# Patient Record
Sex: Male | Born: 1960 | Hispanic: Yes | Marital: Married | Smoking: Former smoker
Health system: Southern US, Community
[De-identification: ages and names within clinical notes are randomized; demographics above are authoritative.]

## PROBLEM LIST (undated history)

## (undated) DIAGNOSIS — M48061 Spinal stenosis, lumbar region without neurogenic claudication: Secondary | ICD-10-CM

## (undated) DIAGNOSIS — T4145XA Adverse effect of unspecified anesthetic, initial encounter: Secondary | ICD-10-CM

## (undated) DIAGNOSIS — M545 Low back pain, unspecified: Secondary | ICD-10-CM

## (undated) DIAGNOSIS — M5416 Radiculopathy, lumbar region: Secondary | ICD-10-CM

## (undated) DIAGNOSIS — M4317 Spondylolisthesis, lumbosacral region: Secondary | ICD-10-CM

## (undated) DIAGNOSIS — M1990 Unspecified osteoarthritis, unspecified site: Secondary | ICD-10-CM

## (undated) HISTORY — DX: Spinal stenosis, lumbar region without neurogenic claudication: M48.061

## (undated) HISTORY — PX: REPLACEMENT TOTAL KNEE: SUR1224

## (undated) HISTORY — DX: Spondylolisthesis, lumbosacral region: M43.17

## (undated) HISTORY — DX: Radiculopathy, lumbar region: M54.16

## (undated) HISTORY — DX: Low back pain, unspecified: M54.50

---
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
## 2015-01-13 DEATH — deceased

## 2019-05-26 ENCOUNTER — Other Ambulatory Visit: Payer: Self-pay | Admitting: Orthopedic Surgery

## 2019-05-26 ENCOUNTER — Other Ambulatory Visit: Payer: Self-pay | Admitting: *Deleted

## 2019-05-27 ENCOUNTER — Telehealth: Payer: Self-pay | Admitting: *Deleted

## 2019-05-27 NOTE — Telephone Encounter (Signed)
Spoke with Lady Saucier case manager at [phone]). She will notify patient of appointment time and that he must have latest MRI disc with him at this appointment. She will arrange interpreter to be at this appointment with patient.

## 2019-06-14 ENCOUNTER — Telehealth: Payer: Self-pay | Admitting: *Deleted

## 2019-06-14 NOTE — Telephone Encounter (Signed)
Confirmed with Lady Saucier at [facility] patient is set up for appt on 06/15/2019 with Dr. Donnetta Hutching. Office/staff have arranged transportation, interpreter, MRI disc and will have patient at this office at 3:05 pm.

## 2019-06-15 ENCOUNTER — Ambulatory Visit (INDEPENDENT_AMBULATORY_CARE_PROVIDER_SITE_OTHER): Payer: Self-pay | Admitting: Vascular Surgery

## 2019-06-15 ENCOUNTER — Encounter: Payer: Self-pay | Admitting: Vascular Surgery

## 2019-06-15 ENCOUNTER — Other Ambulatory Visit: Payer: Self-pay

## 2019-06-15 VITALS — BP 124/80 | HR 79 | Temp 97.9°F | Resp 20 | Ht 66.0 in | Wt 216.8 lb

## 2019-06-15 DIAGNOSIS — M5137 Other intervertebral disc degeneration, lumbosacral region: Secondary | ICD-10-CM

## 2019-06-15 NOTE — Progress Notes (Signed)
Vascular and Vein Specialist of [facility]  Patient name: Calvin Marsh MRN: [id number] DOB: 02/16/1961 Sex: male  REASON FOR CONSULT: Discuss anterior exposure for L5-S1 disc fusion with Dr. Yevette Edwards  HPI: Calvin Marsh is a 58 y.o. male, who is here for discussion of planned lumbar fusion from anterior approach on 07/01/2019.  He is here with his workman Counsellor.  He is Spanish-speaking but understands some [place].  The workman comp representative is also helping with interpretation.  He reports progressive pain and is failed conservative treatment.  He has seen Dr. Yevette Edwards was recommended fusion at the L5-S1 level from the anterior approach.  He has never had any intra-abdominal surgery.  Does report severe pain in his back and legs.  He has no history of cardiac disease.  Past Medical History:  Diagnosis Date  . Bilateral lumbar radiculopathy   . Low back pain    Left greater than right leg pain  . 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 Spondylolisthesis at L5-S1 level     History reviewed. No pertinent family history.  SOCIAL HISTORY: Social History   Socioeconomic History  . Marital status: Married    Spouse name: Not on file  . Number of children: Not on file  . Years of education: Not on file  . Highest education level: Not on file  Occupational History  . Occupation: Programmer, systems  . Financial resource strain: Not on file  . Food insecurity    Worry: Not on file    Inability: Not on file  . Transportation needs    Medical: Not on file    Non-medical: Not on file  Tobacco Use  . Smoking status: Never Smoker  . Smokeless tobacco: Never Used  Substance and Sexual Activity  . Alcohol use: Yes    Comment: occasional  . Drug use: Not on file  . Sexual activity: Not on file  Lifestyle  . Physical activity    Days per week: Not on file    Minutes  per session: Not on file  . Stress: Not on file  Relationships  . Social Musician on phone: Not on file    Gets together: Not on file    Attends religious service: Not on file    Active member of club or organization: Not on file    Attends meetings of clubs or organizations: Not on file    Relationship status: Not on file  . Intimate partner violence    Fear of current or ex partner: Not on file    Emotionally abused: Not on file    Physically abused: Not on file    Forced sexual activity: Not on file  Other Topics Concern  . Not on file  Social History Narrative  . Not on file    No Known Allergies  Current Outpatient Medications  Medication Sig Dispense Refill  . cyclobenzaprine (FLEXERIL) 10 MG tablet TAKE 1 TABLET BY MOUTH AT BEDTIME    . sildenafil (VIAGRA) 50 MG tablet SMARTSIG:1-2 Tablet(s) By Mouth As Needed    . traMADol (ULTRAM) 50 MG tablet Take by mouth.     No current facility-administered medications for this visit.     REVIEW OF SYSTEMS:  [X]  denotes positive finding, [ ]  denotes negative finding Cardiac  Comments:  Chest pain or chest pressure:    Shortness of breath upon exertion:    Short of breath  when lying flat:    Irregular heart rhythm:        Vascular    Pain in calf, thigh, or hip brought on by ambulation:    Pain in feet at night that wakes you up from your sleep:     Blood clot in your veins:    Leg swelling:         Pulmonary    Oxygen at home:    Productive cough:     Wheezing:         Neurologic    Sudden weakness in arms or legs:     Sudden numbness in arms or legs:     Sudden onset of difficulty speaking or slurred speech:    Temporary loss of vision in one eye:     Problems with dizziness:         Gastrointestinal    Blood in stool:     Vomited blood:         Genitourinary    Burning when urinating:     Blood in urine:        Psychiatric    Major depression:         Hematologic    Bleeding problems:     Problems with blood clotting too easily:        Skin    Rashes or ulcers:        Constitutional    Fever or chills:      PHYSICAL EXAM: Vitals:   06/15/19 1508  BP: 124/80  Pulse: 79  Resp: 20  Temp: 97.9 F (36.6 C)  SpO2: 94%  Weight: 216 lb 12.8 oz (98.3 kg)  Height: 5\' 6"  (1.676 m)    GENERAL: The patient is a well-nourished male, in no acute distress. The vital signs are documented above. CARDIOVASCULAR: 2+ radial and 2+ dorsalis pedis pulses bilaterally PULMONARY: There is good air exchange  ABDOMEN: Soft and non-tender  MUSCULOSKELETAL: There are no major deformities or cyanosis. NEUROLOGIC: No focal weakness or paresthesias are detected. SKIN: There are no ulcers or rashes noted. PSYCHIATRIC: The patient has a normal affect.    MEDICAL ISSUES: I have discussed my role for exposure.  Discussed the technical aspects of mobilization of the rectus muscle, retroperitoneal exposure to the L5-S1 disc.  Explained mobilization of arterial venous structures overlying the spine and potential injury for all of these.  The patient does have multiple questions regarding the technical aspects of fusion and I have asked him to clarify this further with Dr. Lynann Bologna.  The understands my role for exposure.  I do not see any contraindication to planned for anterior exposure as scheduled on 07/01/2019   Rosetta Posner, MD [facility] Vascular and Vein Specialists of [facility] Tel [phone] Pager [phone]

## 2019-06-25 NOTE — Pre-Procedure Instructions (Signed)
Calvin Marsh  06/25/2019      [address], [place] [address] [place] [address] Phone: [phone] Fax: [phone]    Your procedure is scheduled on Dec. 17  Report to [facility] Entrance A at 5:30 A.M.  Call this number if you have problems the morning of surgery:  [phone]   Remember:  Do not eat  after midnight.  You may drink clear liquids until 4:30 A.M. .  Clear liquids allowed are:                    Water, Juice (non-citric and without pulp), Carbonated beverages, Clear Tea, Black Coffee only, Plain Jell-O only, Gatorade and Plain Popsicles only             Please complete your PRE-SURGERY ENSURE that was provided to you by 4:30 the morning of surgery.  Please, if able, drink it in one setting. DO NOT SIP.    Take these medicines the morning of surgery with A SIP OF WATER :             Tramadol if needed             7 days prior to surgery STOP taking any Aspirin (unless otherwise instructed by your surgeon), Aleve, Naproxen, Ibuprofen, Motrin, Advil, Goody's, BC's, all herbal medications, fish oil, and all vitamins.    Do not wear jewelry.  Do not wear lotions, powders, or perfumes, or deodorant.  Do not shave 48 hours prior to surgery.  Men may shave face and neck.  Do not bring valuables to the hospital.  [facility] is not responsible for any belongings or valuables.  Contacts, dentures or bridgework may not be worn into surgery.  Leave your suitcase in the car.  After surgery it may be brought to your room.  For patients admitted to the hospital, discharge time will be determined by your treatment team.  Patients discharged the day of surgery will not be allowed to drive home.    Special instructions:  [facility]- Preparing For Surgery  Before surgery, you can play an important role. Because skin is not sterile, your skin needs to be as free of germs as possible. You can reduce the number  of germs on your skin by washing with CHG (chlorahexidine gluconate) Soap before surgery.  CHG is an antiseptic cleaner which kills germs and bonds with the skin to continue killing germs even after washing.    Oral Hygiene is also important to reduce your risk of infection.  Remember - BRUSH YOUR TEETH THE MORNING OF SURGERY WITH YOUR REGULAR TOOTHPASTE  Please do not use if you have an allergy to CHG or antibacterial soaps. If your skin becomes reddened/irritated stop using the CHG.  Do not shave (including legs and underarms) for at least 48 hours prior to first CHG shower. It is OK to shave your face.  Please follow these instructions carefully.   1. Shower the NIGHT BEFORE SURGERY and the MORNING OF SURGERY with CHG.   2. If you chose to wash your hair, wash your hair first as usual with your normal shampoo.  3. After you shampoo, rinse your hair and body thoroughly to remove the shampoo.  4. Use CHG as you would any other liquid soap. You can apply CHG directly to the skin and wash gently with a scrungie or a clean washcloth.   5. Apply the  CHG Soap to your body ONLY FROM THE NECK DOWN.  Do not use on open wounds or open sores. Avoid contact with your eyes, ears, mouth and genitals (private parts). Wash Face and genitals (private parts)  with your normal soap.  6. Wash thoroughly, paying special attention to the area where your surgery will be performed.  7. Thoroughly rinse your body with warm water from the neck down.  8. DO NOT shower/wash with your normal soap after using and rinsing off the CHG Soap.  9. Pat yourself dry with a CLEAN TOWEL.  10. Wear CLEAN PAJAMAS to bed the night before surgery, wear comfortable clothes the morning of surgery  11. Place CLEAN SHEETS on your bed the night of your first shower and DO NOT SLEEP WITH PETS.    Day of Surgery:  Do not apply any deodorants/lotions.  Please wear clean clothes to the hospital/surgery center.   Remember to  brush your teeth WITH YOUR REGULAR TOOTHPASTE.    Please read over the following fact sheets that you were given. Coughing and Deep Breathing and Surgical Site Infection Prevention

## 2019-06-28 ENCOUNTER — Inpatient Hospital Stay (HOSPITAL_COMMUNITY): Admission: RE | Admit: 2019-06-28 | Payer: Self-pay | Source: Ambulatory Visit

## 2019-06-28 ENCOUNTER — Encounter (HOSPITAL_COMMUNITY)
Admission: RE | Admit: 2019-06-28 | Discharge: 2019-06-28 | Disposition: A | Discharge status: Home / Self Care | Source: Ambulatory Visit | Attending: Orthopedic Surgery | Admitting: Orthopedic Surgery

## 2019-06-28 ENCOUNTER — Other Ambulatory Visit: Payer: Self-pay

## 2019-06-28 ENCOUNTER — Encounter (HOSPITAL_COMMUNITY): Payer: Self-pay

## 2019-06-28 DIAGNOSIS — M5416 Radiculopathy, lumbar region: Secondary | ICD-10-CM | POA: Insufficient documentation

## 2019-06-28 DIAGNOSIS — M4317 Spondylolisthesis, lumbosacral region: Secondary | ICD-10-CM | POA: Insufficient documentation

## 2019-06-28 DIAGNOSIS — Z01812 Encounter for preprocedural laboratory examination: Secondary | ICD-10-CM | POA: Insufficient documentation

## 2019-06-28 DIAGNOSIS — M4807 Spinal stenosis, lumbosacral region: Secondary | ICD-10-CM | POA: Insufficient documentation

## 2019-06-28 LAB — CBC WITH DIFFERENTIAL/PLATELET
Abs Immature Granulocytes: 0.02 10*3/uL (ref 0.00–0.07)
Basophils Absolute: 0 10*3/uL (ref 0.0–0.1)
Basophils Relative: 1 %
Eosinophils Absolute: 0.1 10*3/uL (ref 0.0–0.5)
Eosinophils Relative: 1 %
HCT: 44.3 % (ref 39.0–52.0)
Hemoglobin: 15.1 g/dL (ref 13.0–17.0)
Immature Granulocytes: 0 %
Lymphocytes Relative: 44 %
Lymphs Abs: 3.3 10*3/uL (ref 0.7–4.0)
MCH: 34.5 pg — ABNORMAL HIGH (ref 26.0–34.0)
MCHC: 34.1 g/dL (ref 30.0–36.0)
MCV: 101.1 fL — ABNORMAL HIGH (ref 80.0–100.0)
Monocytes Absolute: 0.4 10*3/uL (ref 0.1–1.0)
Monocytes Relative: 5 %
Neutro Abs: 3.7 10*3/uL (ref 1.7–7.7)
Neutrophils Relative %: 49 %
Platelets: 268 10*3/uL (ref 150–400)
RBC: 4.38 MIL/uL (ref 4.22–5.81)
RDW: 13.3 % (ref 11.5–15.5)
WBC: 7.5 10*3/uL (ref 4.0–10.5)
nRBC: 0 % (ref 0.0–0.2)

## 2019-06-28 LAB — COMPREHENSIVE METABOLIC PANEL
ALT: 24 U/L (ref 0–44)
AST: 25 U/L (ref 15–41)
Albumin: 4.3 g/dL (ref 3.5–5.0)
Alkaline Phosphatase: 61 U/L (ref 38–126)
Anion gap: 9 (ref 5–15)
BUN: 14 mg/dL (ref 6–20)
CO2: 27 mmol/L (ref 22–32)
Calcium: 9.2 mg/dL (ref 8.9–10.3)
Chloride: 104 mmol/L (ref 98–111)
Creatinine, Ser: 0.89 mg/dL (ref 0.61–1.24)
GFR calc Af Amer: >60 mL/min (ref >60)
GFR calc non Af Amer: >60 mL/min (ref >60)
Glucose, Bld: 120 mg/dL — ABNORMAL HIGH (ref 70–99)
Potassium: 3.8 mmol/L (ref 3.5–5.1)
Sodium: 140 mmol/L (ref 135–145)
Total Bilirubin: 0.7 mg/dL (ref 0.3–1.2)
Total Protein: 6.8 g/dL (ref 6.5–8.1)

## 2019-06-28 LAB — TYPE AND SCREEN
ABO/RH(D): O POS
Antibody Screen: NEGATIVE

## 2019-06-28 LAB — URINALYSIS, ROUTINE W REFLEX MICROSCOPIC
Bilirubin Urine: NEGATIVE
Glucose, UA: NEGATIVE mg/dL
Hgb urine dipstick: NEGATIVE
Ketones, ur: NEGATIVE mg/dL
Leukocytes,Ua: NEGATIVE
Nitrite: NEGATIVE
Protein, ur: NEGATIVE mg/dL
Specific Gravity, Urine: 1.025 (ref 1.005–1.030)
pH: 5 (ref 5.0–8.0)

## 2019-06-28 LAB — SURGICAL PCR SCREEN
MRSA, PCR: NEGATIVE
Staphylococcus aureus: NEGATIVE

## 2019-06-28 LAB — ABO/RH: ABO/RH(D): O POS

## 2019-06-28 LAB — PROTIME-INR
INR: 1 (ref 0.8–1.2)
Prothrombin Time: 13 seconds (ref 11.4–15.2)

## 2019-06-28 LAB — APTT: aPTT: 33 seconds (ref 24–36)

## 2019-06-28 NOTE — Progress Notes (Signed)
PCP:  Uses Urgent Care if needed Cardiologist:  Denies  EKG: N/A CXR:  N/A ECHO:  denies Stress Test:  denies Cardiac Cath:  denies  Covid test 06/28/19  Patient denies shortness of breath, fever, cough, and chest pain at PAT appointment.  Patient verbalized understanding of instructions provided today at the PAT appointment.  Patient asked to review instructions at home and day of surgery.

## 2019-06-30 NOTE — Anesthesia Preprocedure Evaluation (Addendum)
Anesthesia Evaluation  Patient identified by MRN, date of birth, ID band Patient awake    Reviewed: Allergy & Precautions, H&P , NPO status , Patient's Chart, lab work & pertinent test results  Airway Mallampati: I  TM Distance: >3 FB Neck ROM: Full    Dental no notable dental hx. (+) Edentulous Upper, Dental Advisory Given   Pulmonary neg pulmonary ROS, former smoker,    Pulmonary exam normal breath sounds clear to auscultation       Cardiovascular Exercise Tolerance: Good negative cardio ROS   Rhythm:Regular Rate:Normal     Neuro/Psych negative neurological ROS  negative psych ROS   GI/Hepatic negative GI ROS, Neg liver ROS,   Endo/Other  negative endocrine ROS  Renal/GU negative Renal ROS  negative genitourinary   Musculoskeletal   Abdominal   Peds  Hematology negative hematology ROS (+)   Anesthesia Other Findings   Reproductive/Obstetrics negative OB ROS                            Anesthesia Physical Anesthesia Plan  ASA: II  Anesthesia Plan: General   Post-op Pain Management:    Induction: Intravenous  PONV Risk Score and Plan: 3 and Ondansetron, Dexamethasone and Midazolam  Airway Management Planned: Oral ETT  Additional Equipment: Arterial line  Intra-op Plan:   Post-operative Plan: Extubation in OR and Possible Post-op intubation/ventilation  Informed Consent: I have reviewed the patients History and Physical, chart, labs and discussed the procedure including the risks, benefits and alternatives for the proposed anesthesia with the patient or authorized representative who has indicated his/her understanding and acceptance.     Dental advisory given  Plan Discussed with: CRNA  Anesthesia Plan Comments:         Anesthesia Quick Evaluation

## 2019-07-01 ENCOUNTER — Inpatient Hospital Stay (HOSPITAL_COMMUNITY)
Admission: RE | Admit: 2019-07-01 | Discharge: 2019-07-02 | DRG: 455 | Disposition: A | Discharge status: Home / Self Care | Attending: Orthopedic Surgery | Admitting: Orthopedic Surgery

## 2019-07-01 ENCOUNTER — Inpatient Hospital Stay (HOSPITAL_COMMUNITY): Admitting: Certified Registered"

## 2019-07-01 ENCOUNTER — Encounter (HOSPITAL_COMMUNITY)
Admission: RE | Disposition: A | Discharge status: Home / Self Care | Payer: Self-pay | Source: Home / Self Care | Attending: Orthopedic Surgery

## 2019-07-01 ENCOUNTER — Inpatient Hospital Stay (HOSPITAL_COMMUNITY)

## 2019-07-01 ENCOUNTER — Encounter (HOSPITAL_COMMUNITY): Payer: Self-pay | Admitting: Orthopedic Surgery

## 2019-07-01 ENCOUNTER — Other Ambulatory Visit: Payer: Self-pay

## 2019-07-01 DIAGNOSIS — Z79899 Other long term (current) drug therapy: Secondary | ICD-10-CM | POA: Diagnosis not present

## 2019-07-01 DIAGNOSIS — M4317 Spondylolisthesis, lumbosacral region: Secondary | ICD-10-CM | POA: Diagnosis present

## 2019-07-01 DIAGNOSIS — Z96651 Presence of right artificial knee joint: Secondary | ICD-10-CM | POA: Diagnosis present

## 2019-07-01 DIAGNOSIS — M5410 Radiculopathy, site unspecified: Secondary | ICD-10-CM | POA: Diagnosis present

## 2019-07-01 DIAGNOSIS — M5117 Intervertebral disc disorders with radiculopathy, lumbosacral region: Secondary | ICD-10-CM | POA: Diagnosis present

## 2019-07-01 DIAGNOSIS — M4807 Spinal stenosis, lumbosacral region: Secondary | ICD-10-CM | POA: Diagnosis present

## 2019-07-01 DIAGNOSIS — Z20828 Contact with and (suspected) exposure to other viral communicable diseases: Secondary | ICD-10-CM | POA: Diagnosis present

## 2019-07-01 DIAGNOSIS — Z87891 Personal history of nicotine dependence: Secondary | ICD-10-CM

## 2019-07-01 DIAGNOSIS — Z419 Encounter for procedure for purposes other than remedying health state, unspecified: Secondary | ICD-10-CM

## 2019-07-01 DIAGNOSIS — M5416 Radiculopathy, lumbar region: Secondary | ICD-10-CM | POA: Diagnosis not present

## 2019-07-01 HISTORY — PX: ANTERIOR LUMBAR FUSION: SHX1170

## 2019-07-01 LAB — RESPIRATORY PANEL BY RT PCR (FLU A&B, COVID)
Influenza A by PCR: NEGATIVE
Influenza B by PCR: NEGATIVE
SARS Coronavirus 2 by RT PCR: NEGATIVE

## 2019-07-01 SURGERY — ANTERIOR LUMBAR FUSION 2 LEVELS
Anesthesia: General | Laterality: Bilateral

## 2019-07-01 MED ORDER — BISACODYL 5 MG PO TBEC
5.0000 mg | DELAYED_RELEASE_TABLET | Freq: Every day | ORAL | Status: DC | PRN
  Administered 2019-07-01: 21:00:00 5 mg via ORAL
  Filled 2019-07-01: qty 1

## 2019-07-01 MED ORDER — PROPOFOL 10 MG/ML IV BOLUS
INTRAVENOUS | Status: DC | PRN
  Administered 2019-07-01: 120 mg via INTRAVENOUS
  Administered 2019-07-01: 30 mg via INTRAVENOUS
  Administered 2019-07-01: 50 mg via INTRAVENOUS

## 2019-07-01 MED ORDER — PHENYLEPHRINE 40 MCG/ML (10ML) SYRINGE FOR IV PUSH (FOR BLOOD PRESSURE SUPPORT)
PREFILLED_SYRINGE | INTRAVENOUS | Status: DC | PRN
  Administered 2019-07-01: 80 ug via INTRAVENOUS
  Administered 2019-07-01: 120 ug via INTRAVENOUS
  Administered 2019-07-01 (×5): 80 ug via INTRAVENOUS

## 2019-07-01 MED ORDER — PHENYLEPHRINE 40 MCG/ML (10ML) SYRINGE FOR IV PUSH (FOR BLOOD PRESSURE SUPPORT)
PREFILLED_SYRINGE | INTRAVENOUS | Status: AC
  Filled 2019-07-01: qty 10

## 2019-07-01 MED ORDER — PROPOFOL 500 MG/50ML IV EMUL
INTRAVENOUS | Status: DC | PRN
  Administered 2019-07-01: 50 ug/kg/min via INTRAVENOUS

## 2019-07-01 MED ORDER — LIDOCAINE 2% (20 MG/ML) 5 ML SYRINGE
INTRAMUSCULAR | Status: AC
  Filled 2019-07-01: qty 5

## 2019-07-01 MED ORDER — BUPIVACAINE LIPOSOME 1.3 % IJ SUSP
20.0000 mL | INTRAMUSCULAR | Status: AC
  Administered 2019-07-01: 20 mL
  Filled 2019-07-01: qty 20

## 2019-07-01 MED ORDER — CHLORHEXIDINE GLUCONATE 4 % EX LIQD: 60.0000 mL | Freq: Once | CUTANEOUS | Status: DC

## 2019-07-01 MED ORDER — LIDOCAINE 2% (20 MG/ML) 5 ML SYRINGE
INTRAMUSCULAR | Status: DC | PRN
  Administered 2019-07-01: 60 mg via INTRAVENOUS

## 2019-07-01 MED ORDER — ACETAMINOPHEN 500 MG PO TABS: 1000.0000 mg | ORAL_TABLET | Freq: Once | ORAL | Status: AC

## 2019-07-01 MED ORDER — CEFAZOLIN SODIUM-DEXTROSE 2-4 GM/100ML-% IV SOLN
2.0000 g | Freq: Three times a day (TID) | INTRAVENOUS | Status: AC
  Administered 2019-07-01 (×2): 2 g via INTRAVENOUS
  Filled 2019-07-01 (×2): qty 100

## 2019-07-01 MED ORDER — SODIUM CHLORIDE 0.9 % IV SOLN: 250.0000 mL | INTRAVENOUS | Status: DC

## 2019-07-01 MED ORDER — OXYCODONE-ACETAMINOPHEN 5-325 MG PO TABS
1.0000 | ORAL_TABLET | ORAL | Status: DC | PRN
  Administered 2019-07-01 – 2019-07-02 (×5): 2 via ORAL
  Filled 2019-07-01 (×5): qty 2

## 2019-07-01 MED ORDER — POTASSIUM CHLORIDE IN NACL 20-0.9 MEQ/L-% IV SOLN: INTRAVENOUS | Status: DC

## 2019-07-01 MED ORDER — ACETAMINOPHEN 325 MG PO TABS: 650.0000 mg | ORAL_TABLET | ORAL | Status: DC | PRN

## 2019-07-01 MED ORDER — DEXAMETHASONE SODIUM PHOSPHATE 10 MG/ML IJ SOLN
INTRAMUSCULAR | Status: AC
  Filled 2019-07-01: qty 1

## 2019-07-01 MED ORDER — METHYLENE BLUE 0.5 % INJ SOLN
INTRAVENOUS | Status: AC
  Filled 2019-07-01: qty 10

## 2019-07-01 MED ORDER — EPINEPHRINE PF 1 MG/ML IJ SOLN
INTRAMUSCULAR | Status: DC | PRN
  Administered 2019-07-01: 1 mg

## 2019-07-01 MED ORDER — HYDROMORPHONE HCL 1 MG/ML IJ SOLN
INTRAMUSCULAR | Status: AC
  Filled 2019-07-01: qty 1

## 2019-07-01 MED ORDER — ONDANSETRON HCL 4 MG PO TABS: 4.0000 mg | ORAL_TABLET | Freq: Four times a day (QID) | ORAL | Status: DC | PRN

## 2019-07-01 MED ORDER — ZOLPIDEM TARTRATE 5 MG PO TABS: 5.0000 mg | ORAL_TABLET | Freq: Every evening | ORAL | Status: DC | PRN

## 2019-07-01 MED ORDER — ONDANSETRON HCL 4 MG/2ML IJ SOLN
INTRAMUSCULAR | Status: AC
  Filled 2019-07-01: qty 2

## 2019-07-01 MED ORDER — MENTHOL 3 MG MT LOZG: 1.0000 | LOZENGE | OROMUCOSAL | Status: DC | PRN

## 2019-07-01 MED ORDER — MIDAZOLAM HCL 2 MG/2ML IJ SOLN
INTRAMUSCULAR | Status: DC | PRN
  Administered 2019-07-01: 2 mg via INTRAVENOUS

## 2019-07-01 MED ORDER — PHENYLEPHRINE HCL-NACL 10-0.9 MG/250ML-% IV SOLN
INTRAVENOUS | Status: DC | PRN
  Administered 2019-07-01: 15 ug/min via INTRAVENOUS

## 2019-07-01 MED ORDER — FENTANYL CITRATE (PF) 250 MCG/5ML IJ SOLN
INTRAMUSCULAR | Status: AC
  Filled 2019-07-01: qty 5

## 2019-07-01 MED ORDER — POVIDONE-IODINE 7.5 % EX SOLN
Freq: Once | CUTANEOUS | Status: DC
  Filled 2019-07-01: qty 118

## 2019-07-01 MED ORDER — LACTATED RINGERS IV SOLN: INTRAVENOUS | Status: DC | PRN

## 2019-07-01 MED ORDER — PHENOL 1.4 % MT LIQD: 1.0000 | OROMUCOSAL | Status: DC | PRN

## 2019-07-01 MED ORDER — HYDROMORPHONE HCL 1 MG/ML IJ SOLN
0.2500 mg | INTRAMUSCULAR | Status: DC | PRN
  Administered 2019-07-01 (×2): 0.5 mg via INTRAVENOUS

## 2019-07-01 MED ORDER — DOCUSATE SODIUM 100 MG PO CAPS
100.0000 mg | ORAL_CAPSULE | Freq: Two times a day (BID) | ORAL | Status: DC
  Administered 2019-07-01 – 2019-07-02 (×3): 100 mg via ORAL
  Filled 2019-07-01 (×3): qty 1

## 2019-07-01 MED ORDER — ALUM & MAG HYDROXIDE-SIMETH 200-200-20 MG/5ML PO SUSP: 30.0000 mL | Freq: Four times a day (QID) | ORAL | Status: DC | PRN

## 2019-07-01 MED ORDER — METHOCARBAMOL 500 MG PO TABS
500.0000 mg | ORAL_TABLET | Freq: Four times a day (QID) | ORAL | Status: DC | PRN
  Administered 2019-07-01 – 2019-07-02 (×3): 500 mg via ORAL
  Filled 2019-07-01 (×3): qty 1

## 2019-07-01 MED ORDER — 0.9 % SODIUM CHLORIDE (POUR BTL) OPTIME
TOPICAL | Status: DC | PRN
  Administered 2019-07-01: 09:00:00 1000 mL

## 2019-07-01 MED ORDER — SUGAMMADEX SODIUM 200 MG/2ML IV SOLN
INTRAVENOUS | Status: DC | PRN
  Administered 2019-07-01: 200 mg via INTRAVENOUS

## 2019-07-01 MED ORDER — EPINEPHRINE PF 1 MG/ML IJ SOLN
INTRAMUSCULAR | Status: AC
  Filled 2019-07-01: qty 1

## 2019-07-01 MED ORDER — ALBUMIN HUMAN 5 % IV SOLN: INTRAVENOUS | Status: DC | PRN

## 2019-07-01 MED ORDER — CEFAZOLIN SODIUM-DEXTROSE 2-4 GM/100ML-% IV SOLN
2.0000 g | INTRAVENOUS | Status: AC
  Administered 2019-07-01: 2 g via INTRAVENOUS

## 2019-07-01 MED ORDER — ONDANSETRON HCL 4 MG/2ML IJ SOLN
INTRAMUSCULAR | Status: DC | PRN
  Administered 2019-07-01: 4 mg via INTRAVENOUS

## 2019-07-01 MED ORDER — MIDAZOLAM HCL 2 MG/2ML IJ SOLN
INTRAMUSCULAR | Status: AC
  Filled 2019-07-01: qty 2

## 2019-07-01 MED ORDER — METHOCARBAMOL 1000 MG/10ML IJ SOLN: 500.0000 mg | Freq: Four times a day (QID) | INTRAVENOUS | Status: DC | PRN

## 2019-07-01 MED ORDER — ACETAMINOPHEN 500 MG PO TABS
ORAL_TABLET | ORAL | Status: AC
  Administered 2019-07-01: 07:00:00 1000 mg via ORAL
  Filled 2019-07-01: qty 2

## 2019-07-01 MED ORDER — MORPHINE SULFATE (PF) 2 MG/ML IV SOLN
1.0000 mg | INTRAVENOUS | Status: DC | PRN
  Administered 2019-07-01: 2 mg via INTRAVENOUS
  Filled 2019-07-01: qty 1

## 2019-07-01 MED ORDER — BUPIVACAINE HCL (PF) 0.25 % IJ SOLN
INTRAMUSCULAR | Status: DC | PRN
  Administered 2019-07-01: 30 mL

## 2019-07-01 MED ORDER — ROCURONIUM BROMIDE 10 MG/ML (PF) SYRINGE
PREFILLED_SYRINGE | INTRAVENOUS | Status: DC | PRN
  Administered 2019-07-01: 10 mg via INTRAVENOUS
  Administered 2019-07-01: 50 mg via INTRAVENOUS
  Administered 2019-07-01 (×2): 20 mg via INTRAVENOUS

## 2019-07-01 MED ORDER — THROMBIN 20000 UNITS EX KIT
PACK | CUTANEOUS | Status: AC
  Filled 2019-07-01: qty 1

## 2019-07-01 MED ORDER — PROPOFOL 10 MG/ML IV BOLUS
INTRAVENOUS | Status: AC
  Filled 2019-07-01: qty 20

## 2019-07-01 MED ORDER — FENTANYL CITRATE (PF) 100 MCG/2ML IJ SOLN
INTRAMUSCULAR | Status: DC | PRN
  Administered 2019-07-01 (×2): 50 ug via INTRAVENOUS

## 2019-07-01 MED ORDER — SODIUM CHLORIDE 0.9% FLUSH: 3.0000 mL | INTRAVENOUS | Status: DC | PRN

## 2019-07-01 MED ORDER — CEFAZOLIN SODIUM-DEXTROSE 2-4 GM/100ML-% IV SOLN
INTRAVENOUS | Status: AC
  Filled 2019-07-01: qty 100

## 2019-07-01 MED ORDER — SENNOSIDES-DOCUSATE SODIUM 8.6-50 MG PO TABS: 1.0000 | ORAL_TABLET | Freq: Every evening | ORAL | Status: DC | PRN

## 2019-07-01 MED ORDER — ONDANSETRON HCL 4 MG/2ML IJ SOLN: 4.0000 mg | Freq: Four times a day (QID) | INTRAMUSCULAR | Status: DC | PRN

## 2019-07-01 MED ORDER — SODIUM CHLORIDE 0.9% FLUSH
3.0000 mL | Freq: Two times a day (BID) | INTRAVENOUS | Status: DC
  Administered 2019-07-01: 21:00:00 3 mL via INTRAVENOUS

## 2019-07-01 MED ORDER — HEMOSTATIC AGENTS (NO CHARGE) OPTIME
TOPICAL | Status: DC | PRN
  Administered 2019-07-01: 1 via TOPICAL

## 2019-07-01 MED ORDER — FLEET ENEMA 7-19 GM/118ML RE ENEM: 1.0000 | ENEMA | Freq: Once | RECTAL | Status: DC | PRN

## 2019-07-01 MED ORDER — BUPIVACAINE HCL (PF) 0.25 % IJ SOLN
INTRAMUSCULAR | Status: AC
  Filled 2019-07-01: qty 30

## 2019-07-01 MED ORDER — DEXAMETHASONE SODIUM PHOSPHATE 10 MG/ML IJ SOLN
INTRAMUSCULAR | Status: DC | PRN
  Administered 2019-07-01: 10 mg via INTRAVENOUS

## 2019-07-01 MED ORDER — ROCURONIUM BROMIDE 10 MG/ML (PF) SYRINGE
PREFILLED_SYRINGE | INTRAVENOUS | Status: AC
  Filled 2019-07-01: qty 10

## 2019-07-01 MED ORDER — ACETAMINOPHEN 650 MG RE SUPP: 650.0000 mg | RECTAL | Status: DC | PRN

## 2019-07-01 SURGICAL SUPPLY — 111 items
APPLIER CLIP 11 MED OPEN (CLIP) ×3
BENZOIN TINCTURE PRP APPL 2/3 (GAUZE/BANDAGES/DRESSINGS) ×4 IMPLANT
BLADE CLIPPER SURG (BLADE) ×2 IMPLANT
BLADE SURG 10 STRL SS (BLADE) ×5 IMPLANT
BONE VIVIGEN FORMABLE 10CC (Bone Implant) ×3 IMPLANT
BUR PRECISION FLUTE 5.0 (BURR) ×2 IMPLANT
BUR PRESCISION 1.7 ELITE (BURR) IMPLANT
BUR ROUND PRECISION 4.0 (BURR) IMPLANT
BUR ROUND PRECISION 4.0MM (BURR)
BUR SABER RD CUTTING 3.0 (BURR) IMPLANT
BUR SABER RD CUTTING 3.0MM (BURR)
CARTRIDGE OIL MAESTRO DRILL (MISCELLANEOUS) ×2 IMPLANT
CLIP APPLIE 11 MED OPEN (CLIP) ×1 IMPLANT
CLOSURE STERI-STRIP 1/2X4 (GAUZE/BANDAGES/DRESSINGS) ×2
CLOSURE WOUND 1/2 X4 (GAUZE/BANDAGES/DRESSINGS)
CLSR STERI-STRIP ANTIMIC 1/2X4 (GAUZE/BANDAGES/DRESSINGS) ×2 IMPLANT
CONT SPEC 4OZ CLIKSEAL STRL BL (MISCELLANEOUS) ×3 IMPLANT
CORD BIPOLAR FORCEPS 12FT (ELECTRODE) ×3 IMPLANT
COVER SURGICAL LIGHT HANDLE (MISCELLANEOUS) ×5 IMPLANT
COVER WAND RF STERILE (DRAPES) ×1 IMPLANT
DIFFUSER DRILL AIR PNEUMATIC (MISCELLANEOUS) ×6 IMPLANT
DRAIN CHANNEL 15F RND FF W/TCR (WOUND CARE) ×1 IMPLANT
DRAPE C-ARM 42X72 X-RAY (DRAPES) ×6 IMPLANT
DRAPE C-ARMOR (DRAPES) ×4 IMPLANT
DRAPE POUCH INSTRU U-SHP 10X18 (DRAPES) ×3 IMPLANT
DRAPE SURG 17X23 STRL (DRAPES) ×12 IMPLANT
DRSG MEPILEX BORDER 4X12 (GAUZE/BANDAGES/DRESSINGS) ×3 IMPLANT
DRSG MEPILEX BORDER 4X8 (GAUZE/BANDAGES/DRESSINGS) IMPLANT
DURAPREP 26ML APPLICATOR (WOUND CARE) ×3 IMPLANT
ELECT BLADE 4.0 EZ CLEAN MEGAD (MISCELLANEOUS) ×3
ELECT CAUTERY BLADE 6.4 (BLADE) ×6 IMPLANT
ELECT REM PT RETURN 9FT ADLT (ELECTROSURGICAL) ×3
ELECTRODE BLDE 4.0 EZ CLN MEGD (MISCELLANEOUS) ×1 IMPLANT
ELECTRODE REM PT RTRN 9FT ADLT (ELECTROSURGICAL) ×1 IMPLANT
EVACUATOR SILICONE 100CC (DRAIN) ×3 IMPLANT
FEE INTRAOP MONITOR IMPULS NCS (MISCELLANEOUS) IMPLANT
GAUZE 4X4 16PLY RFD (DISPOSABLE) ×8 IMPLANT
GAUZE SPONGE 4X4 12PLY STRL (GAUZE/BANDAGES/DRESSINGS) ×3 IMPLANT
GAUZE SPONGE 4X4 16PLY XRAY LF (GAUZE/BANDAGES/DRESSINGS) ×4 IMPLANT
GLOVE BIO SURGEON STRL SZ7 (GLOVE) ×5 IMPLANT
GLOVE BIO SURGEON STRL SZ8 (GLOVE) ×5 IMPLANT
GLOVE BIOGEL PI IND STRL 7.0 (GLOVE) ×1 IMPLANT
GLOVE BIOGEL PI IND STRL 8 (GLOVE) ×2 IMPLANT
GLOVE BIOGEL PI INDICATOR 7.0 (GLOVE) ×4
GLOVE BIOGEL PI INDICATOR 8 (GLOVE) ×4
GOWN STRL REUS W/ TWL LRG LVL3 (GOWN DISPOSABLE) ×2 IMPLANT
GOWN STRL REUS W/ TWL XL LVL3 (GOWN DISPOSABLE) ×1 IMPLANT
GOWN STRL REUS W/TWL LRG LVL3 (GOWN DISPOSABLE) ×4
GOWN STRL REUS W/TWL XL LVL3 (GOWN DISPOSABLE) ×8
GRAFT BNE MATRIX VG FRMBL L 10 (Bone Implant) IMPLANT
GUIDEWIRE BLUNT VIPER II 1.45 (WIRE) ×2 IMPLANT
GUIDEWIRE SHARP VIPER II (WIRE) ×8 IMPLANT
HEMOSTAT SURGICEL 2X14 (HEMOSTASIS) ×2 IMPLANT
INTRAOP MONITOR FEE IMPULS NCS (MISCELLANEOUS) ×1
INTRAOP MONITOR FEE IMPULSE (MISCELLANEOUS) ×2
IV CATH 14GX2 1/4 (CATHETERS) ×3 IMPLANT
KIT ALARA NEURO ACCESS (KITS) ×6 IMPLANT
KIT BASIN OR (CUSTOM PROCEDURE TRAY) ×3 IMPLANT
KIT POSITION SURG JACKSON T1 (MISCELLANEOUS) ×3 IMPLANT
KIT TURNOVER KIT B (KITS) ×3 IMPLANT
MARKER SKIN DUAL TIP RULER LAB (MISCELLANEOUS) ×3 IMPLANT
NDL HYPO 25GX1X1/2 BEV (NEEDLE) ×1 IMPLANT
NDL SPNL 18GX3.5 QUINCKE PK (NEEDLE) ×2 IMPLANT
NEEDLE HYPO 25GX1X1/2 BEV (NEEDLE) ×3 IMPLANT
NEEDLE SPNL 18GX3.5 QUINCKE PK (NEEDLE) ×6 IMPLANT
NS IRRIG 1000ML POUR BTL (IV SOLUTION) ×3 IMPLANT
OIL CARTRIDGE MAESTRO DRILL (MISCELLANEOUS) ×6
PACK LAMINECTOMY ORTHO (CUSTOM PROCEDURE TRAY) ×3 IMPLANT
PACK UNIVERSAL I (CUSTOM PROCEDURE TRAY) ×5 IMPLANT
PAD ARMBOARD 7.5X6 YLW CONV (MISCELLANEOUS) ×6 IMPLANT
PATTIES SURGICAL .5 X1 (DISPOSABLE) ×3 IMPLANT
PATTIES SURGICAL .5 X3 (DISPOSABLE) IMPLANT
PATTIES SURGICAL .5X1.5 (GAUZE/BANDAGES/DRESSINGS) ×3 IMPLANT
PATTIES SURGICAL .75X.75 (GAUZE/BANDAGES/DRESSINGS) ×3 IMPLANT
PROBE PEDICLE 2.3 SCREW BALL TIP ×2 IMPLANT
ROD VIPER II LORDOSED 5.5X40 (Rod) ×4 IMPLANT
SCREW SET SINGLE INNER MIS (Screw) ×8 IMPLANT
SCREW XTAB POLY VIPER  7X45 (Screw) ×8 IMPLANT
SCREW XTAB POLY VIPER 7X45 (Screw) IMPLANT
SPONGE INTESTINAL PEANUT (DISPOSABLE) ×6 IMPLANT
SPONGE SURGIFOAM ABS GEL 100 (HEMOSTASIS) ×6 IMPLANT
STRIP CLOSURE SKIN 1/2X4 (GAUZE/BANDAGES/DRESSINGS) IMPLANT
SURGIFLO W/THROMBIN 8M KIT (HEMOSTASIS) ×2 IMPLANT
SUT MNCRL AB 4-0 PS2 18 (SUTURE) ×3 IMPLANT
SUT PDS AB 1 CTX 36 (SUTURE) ×6 IMPLANT
SUT PROLENE 5 0 C 1 24 (SUTURE) IMPLANT
SUT SILK 2 0 TIES 10X30 (SUTURE) ×3 IMPLANT
SUT SILK 3 0 TIES 10X30 (SUTURE) ×3 IMPLANT
SUT VIC AB 0 CT1 18XCR BRD 8 (SUTURE) ×2 IMPLANT
SUT VIC AB 0 CT1 8-18 (SUTURE) ×4
SUT VIC AB 1 CT1 18XCR BRD 8 (SUTURE) ×2 IMPLANT
SUT VIC AB 1 CT1 27 (SUTURE) ×4
SUT VIC AB 1 CT1 27XBRD ANBCTR (SUTURE) ×2 IMPLANT
SUT VIC AB 1 CT1 8-18 (SUTURE) ×4
SUT VIC AB 1 CTX 36 (SUTURE) ×4
SUT VIC AB 1 CTX36XBRD ANBCTR (SUTURE) ×2 IMPLANT
SUT VIC AB 2-0 CT2 18 VCP726D (SUTURE) ×6 IMPLANT
SYNCAGE EVOL MD 17X11.4 14D (Spacer) ×2 IMPLANT
SYR 20ML LL LF (SYRINGE) ×3 IMPLANT
SYR BULB IRRIGATION 50ML (SYRINGE) ×3 IMPLANT
SYR CONTROL 10ML LL (SYRINGE) ×6 IMPLANT
SYR TB 1ML LUER SLIP (SYRINGE) ×3 IMPLANT
TAP CANN VIPER2 DL 6.0 (TAP) ×4 IMPLANT
TAPE CLOTH SURG 4X10 WHT LF (GAUZE/BANDAGES/DRESSINGS) ×4 IMPLANT
TOWEL GREEN STERILE (TOWEL DISPOSABLE) ×3 IMPLANT
TOWEL GREEN STERILE FF (TOWEL DISPOSABLE) ×3 IMPLANT
TRAY FOLEY MTR SLVR 16FR STAT (SET/KITS/TRAYS/PACK) ×3 IMPLANT
TRAY FOLEY W/BAG SLVR 16FR (SET/KITS/TRAYS/PACK) ×2
TRAY FOLEY W/BAG SLVR 16FR ST (SET/KITS/TRAYS/PACK) ×1 IMPLANT
WATER STERILE IRR 1000ML POUR (IV SOLUTION) ×1 IMPLANT
YANKAUER SUCT BULB TIP NO VENT (SUCTIONS) ×3 IMPLANT

## 2019-07-01 NOTE — Op Note (Signed)
PATIENT NAME: Calvin Marsh   MEDICAL RECORD NO.:   [id number]    DATE OF BIRTH: 03/09/1961   DATE OF PROCEDURE: 07/01/2019                              OPERATIVE REPORT   PREOPERATIVE DIAGNOSES: 1. Bilateral L5 radiculopathy. 2. Severe bilateral neuroforaminal stenosis, L5-S1  3. L5-S1 degenerative disk disease. 4. L5/S1 spondylolisthesis  POSTOPERATIVE DIAGNOSES: 1. Bilateral L5 radiculopathy. 2. Severe bilateral neuroforaminal stenosis, L5-S1  3. L5-S1 degenerative disk disease. 4. L5/S1 spondylolisthesis  PROCEDURE: 1. Anterior lumbar interbody fusion, L5-S1 (expsure peformed by Dr. Tawanna Cooler Early) 2. Insertion of interbody device x1 (Syncage Evolution intervertebral spacer, 33mm, medium, 14 degree lordotic). 3. Placement of anterior instrumentation securing the L5-S1 level. 4. Posterior spinal fusion, L5-S1 5. Placement of posterior instrumentation, L5-S1 6. Intraoperative use of fluoroscopy. 7. Use of morselized allograft - Vivigen 8. 1st assistant to Dr. Tawanna Cooler Early for retroperitoneal exposure  SURGEON:  [name], MD  ASSISTANT:  Jason Coop, PA-C  ANESTHESIA:  General endotracheal anesthesia.  COMPLICATIONS:  None.  DISPOSITION:  Stable.  ESTIMATED BLOOD LOSS:  minimal  INDICATIONS FOR SURGERY:  Briefly, Calvin Marsh is a very pleasant 58 year old male, who has been having ongoing pain in the back and bilateral legs, following a work injury that occurred on 10/28/2018.   The patient's MRI did reveal severe neuroforaminal stenosis at L5-S1, with degenerative disc disease and a spondylolisthesis also noted. The patient did fail appropriate nonoperative measures, but did continue to have significant pain. Given his ongoing pain and dysfunction, we did discuss proceeding with the procedure noted above.  The patient was fully aware of the risks and limitations associated with surgery, and did wish to proceed.    OPERATIVE DETAILS:  On 07/01/2019, the patient  was brought to surgery and general endotracheal anesthesia was administered.  The patient was placed supine on the hospital bed.  The patient's abdomen was prepped and draped in the usual sterile fashion.  An anterior retroperitoneal approach was then performed by Dr. Gretta Began.  Once the anterior lumbar spine was noted, we did focus our attention on the L5-S1 intervertebral space.  I then performed a thorough and complete L5-S1 intervertebral diskectomy to the level of the posterior longitudinal ligament. I was very pleased with the diskectomy that I was able to accomplish.  The endplates were then appropriately prepared and the appropriate sized anterior intervertebral spacer was packed with Vivigen and tamped into position. Of note, during my preoperative templating, it was notable that the patient did have a very tall intervertebral disc, both at the operative level, and that his adjacent segments.  In the course of my templating during surgery, I did feel that a 17 mm implant would be the most appropriate fit, as this did most adequately reproduce the patient's normal anatomy. I was very pleased with the press-fit of the implant.  I then proceeded with placement of anterior instrumentation at the L5-S1 intervertebral space.  To accomplish this, I did use an awl to prepare the trajectory of anterior vertebral body screw.  An anterior fixation device was attached to the back end of the screw and did provide anterior fixation across the L5-S1 intervertebral space, securing the intervertebral implant into place.  I was very pleased with the press-fit of the anterior hardware.  I did liberally use AP and lateral fluoroscopy to ensure that the implant and anterior fixation was  in the appropriate position, and was very pleased with the radiographs. The wound was copiously irrigated.  The fascia was  closed using #1 PDS.  The subcutaneous layer was closed using 0 Vicryl followed by 2-0 Vicryl, and  the skin was closed using 4-0 Monocryl. Benzoin and Steri-Strips were applied followed by sterile dressing.    At this point, the patient was placed prone on a well-padded flat Jackson bed with a spinal frame.  The back was prepped and draped in the usual sterile fashion.  Fluoroscopy was brought into the field.  At this point, I did make paramedian incisions bilaterally, just lateral to the L5 and S1 pedicles.  I then advanced Jamshidi needles across the pedicles bilaterally at L5 and S1. I then exposed the posterior lateral gutter on the left side at L5-S1, including the left L5-S1 facet joint, which was decorticated.  The remainder of the Vivigen was then placed into the posterior lateral gutter and along the left L5-S1 facet joint. Guidewires were then advanced through the Jamshidi's, liberally using AP and lateral fluoroscopy.  I then used a 6 mm tap over the guidewires, to cannulate the L5 and S1 pedicles bilaterally.  7 x 45 mm screws were then placed bilaterally.  40 mm rods were advanced through the tulip heads of the screws bilaterally.  Caps were placed and a final locking procedure was performed.  Is very pleased with the final fusion construct noted on AP and lateral imaging. The subcutaneous layer was closed using 0 Vicryl followed by 2-0 Vicryl, and the skin was closed using 4-0 Monocryl. Benzoin and Steri-Strips were applied followed by sterile dressing. All instrument counts were correct at the termination of the procedure.  I did use neurologic monitoring throughout the surgery, and there was no abnormal EMG activity noted throughout the surgery.  Of note, Pricilla Holm was my assistant throughout surgery, and did aid in retraction, suctioning, placement of the hardware, and closure for both the anterior and posterior portions of the procedure.   Phylliss Bob, MD

## 2019-07-01 NOTE — Op Note (Signed)
    OPERATIVE REPORT  DATE OF SURGERY: 07/01/2019  PATIENT: Calvin Marsh, 58 y.o. male MRN: [id number]  DOB: 1961/01/01  PRE-OPERATIVE DIAGNOSIS: Degenerative disc disease  POST-OPERATIVE DIAGNOSIS:  Same  PROCEDURE: Anterior exposure for L5-S1 disc fusion  SURGEON:  Curt Jews, M.D.  Co-surgeon for the exposure Dr. Phylliss Bob   ANESTHESIA: General  EBL: per anesthesia record  Total I/O In: 1000 [I.V.:1000] Out: 175 [Urine:100; Blood:75]  BLOOD ADMINISTERED: none  DRAINS: none  SPECIMEN: none  COUNTS CORRECT:  YES  PATIENT DISPOSITION:  PACU - hemodynamically stable  PROCEDURE DETAILS: Patient was taken operating place evaluation where the area of the abdomen was prepped draped in sterile fashion.  Crosstable lateral image was used to identify the level of the L5-S1 disc.  Incision was made over this level from the midline to the left lateral position over the rectus muscle.  The incision was carried through the subcutaneous fat with electrocautery.  The anterior rectus sheath was opened with electrocautery in line with the skin incision.  Retroperitoneal space was entered in the left lower quadrant and blunt dissection was used to mobilize the intraperitoneal contents and ureter to the right.  Posterior sheath was opened laterally for exposure.  Blunt dissection was continued above the level of the psoas muscle to the aortic bifurcation.  Blunt dissection over the L5-S1 disc revealed the level of the middle sacral vessels.  These were clipped with ligaclips and divided.  Blunt dissection allowed adequate exposure to the right and left of the L5-S1 disc.  The Thompson retractor was brought onto the field and the reverse lip 150 blades were positioned to the right and left of the L5-S1 disc.  Malleable retractors were used for superior and inferior exposure.  A spinal needle was placed in the L5-S1 disc and C-arm was brought back onto the table to confirm that this was  the appropriate level.  The remainder the procedure will be dictated as a separate note by Dr. Kathrynn Ducking, M.D., [facility] 07/01/2019 10:00 AM

## 2019-07-01 NOTE — OR Nursing (Signed)
Dr. Joellyn Quails called stating no FB seen post op xray for incorrect count.

## 2019-07-01 NOTE — Anesthesia Postprocedure Evaluation (Signed)
Anesthesia Post Note  Patient: Calvin Marsh  Procedure(s) Performed: LUMBAR 5 - SACRUM 1 ANTERIOR LUMBAR INTERBODY FUSION WITH INSTRUMENTATION AND ALLOGRAFT (Bilateral ) LUMBAR 5 - SACRUM 1 POSTERIOR SPINAL FUSION WITH INSTRUMENTATION AND ALLOGRAFT (Bilateral )     Patient location during evaluation: PACU Anesthesia Type: General Level of consciousness: awake and alert Pain management: pain level controlled Vital Signs Assessment: post-procedure vital signs reviewed and stable Respiratory status: spontaneous breathing, nonlabored ventilation and respiratory function stable Cardiovascular status: blood pressure returned to baseline and stable Postop Assessment: no apparent nausea or vomiting Anesthetic complications: no    Last Vitals:  Vitals:   07/01/19 1400 07/01/19 1425  BP:  124/74  Pulse: 69 72  Resp: 13 18  Temp: 36.7 C 36.6 C  SpO2: 92% 96%    Last Pain:  Vitals:   07/01/19 1425  TempSrc: Oral  PainSc:                  Ajah Vanhoose,W. EDMOND

## 2019-07-01 NOTE — Anesthesia Procedure Notes (Signed)
Arterial Line Insertion Start/End12/17/2020 7:35 AM, 07/01/2019 7:40 AM Performed by: Barrington Ellison, CRNA, CRNA  Preanesthetic checklist: patient identified, risks and benefits discussed and pre-op evaluation Lidocaine 1% used for infiltration Left, radial was placed Catheter size: 20 G Hand hygiene performed  and maximum sterile barriers used  Allen's test indicative of satisfactory collateral circulation Attempts: 1 Procedure performed without using ultrasound guided technique. Following insertion, dressing applied and Biopatch. Post procedure assessment: normal  Patient tolerated the procedure well with no immediate complications.

## 2019-07-01 NOTE — Anesthesia Procedure Notes (Signed)
Procedure Name: Intubation Date/Time: 07/01/2019 8:06 AM Performed by: Barrington Ellison, CRNA Pre-anesthesia Checklist: Patient identified, Emergency Drugs available, Suction available and Patient being monitored Patient Re-evaluated:Patient Re-evaluated prior to induction Oxygen Delivery Method: Circle System Utilized Preoxygenation: Pre-oxygenation with 100% oxygen Induction Type: IV induction Ventilation: Mask ventilation without difficulty and Oral airway inserted - appropriate to patient size Laryngoscope Size: Mac and 4 Grade View: Grade I Tube type: Oral Tube size: 7.5 mm Number of attempts: 1 Airway Equipment and Method: Stylet and Oral airway Placement Confirmation: ETT inserted through vocal cords under direct vision,  positive ETCO2 and breath sounds checked- equal and bilateral Secured at: 22 cm Tube secured with: Tape Dental Injury: Teeth and Oropharynx as per pre-operative assessment

## 2019-07-01 NOTE — Transfer of Care (Signed)
Immediate Anesthesia Transfer of Care Note  Patient: Calvin Marsh  Procedure(s) Performed: LUMBAR 5 - SACRUM 1 ANTERIOR LUMBAR INTERBODY FUSION WITH INSTRUMENTATION AND ALLOGRAFT (Bilateral ) LUMBAR 5 - SACRUM 1 POSTERIOR SPINAL FUSION WITH INSTRUMENTATION AND ALLOGRAFT (Bilateral )  Patient Location: PACU  Anesthesia Type:General  Level of Consciousness: awake and patient cooperative  Airway & Oxygen Therapy: Patient Spontanous Breathing  Post-op Assessment: Report given to RN and Patient moving all extremities X 4  Post vital signs: Reviewed and stable  Last Vitals:  Vitals Value Taken Time  BP    Temp    Pulse 71 07/01/19 1224  Resp 14 07/01/19 1224  SpO2 94 % 07/01/19 1224  Vitals shown include unvalidated device data.  Last Pain:  Vitals:   07/01/19 0637  PainSc: 4          Complications: No apparent anesthesia complications

## 2019-07-01 NOTE — H&P (Signed)
PREOPERATIVE H&P  Chief Complaint: Low back pain, bilateral leg pain  HPI: Calvin Marsh is a 58 y.o. male who presents with ongoing pain in the back and bilateral legs  MRI reveals L5/S1 degenerative disc disease, instability, and bilateral stenosis  Patient has failed multiple forms of conservative care and continues to have pain (see office notes for additional details regarding the patient's full course of treatment)  Past Medical History:  Diagnosis Date  . Bilateral lumbar radiculopathy   . Complication of anesthesia   . Low back pain    Left greater than right leg pain  . 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 Spondylolisthesis at L5-S1 level    Past Surgical History:  Procedure Laterality Date  . REPLACEMENT TOTAL KNEE Right    Social History   Socioeconomic History  . Marital status: Married    Spouse name: Not on file  . Number of children: Not on file  . Years of education: Not on file  . Highest education level: Not on file  Occupational History  . Occupation: Gaffer  Tobacco Use  . Smoking status: Former Games developer  . Smokeless tobacco: Never Used  Substance and Sexual Activity  . Alcohol use: Yes    Comment: occasional  . Drug use: Never  . Sexual activity: Not on file  Other Topics Concern  . Not on file  Social History Narrative  . Not on file   Social Determinants of Health   Financial Resource Strain:   . Difficulty of Paying Living Expenses: Not on file  Food Insecurity:   . Worried About Programme researcher, broadcasting/film/video in the Last Year: Not on file  . Ran Out of Food in the Last Year: Not on file  Transportation Needs:   . Lack of Transportation (Medical): Not on file  . Lack of Transportation (Non-Medical): Not on file  Physical Activity:   . Days of Exercise per Week: Not on file  . Minutes of Exercise per Session: Not on file  Stress:   . Feeling of Stress : Not on file  Social Connections:    . Frequency of Communication with Friends and Family: Not on file  . Frequency of Social Gatherings with Friends and Family: Not on file  . Attends Religious Services: Not on file  . Active Member of Clubs or Organizations: Not on file  . Attends Banker Meetings: Not on file  . Marital Status: Not on file   History reviewed. No pertinent family history. No Known Allergies Prior to Admission medications   Medication Sig Start Date End Date Taking? Authorizing Provider  cyclobenzaprine (FLEXERIL) 10 MG tablet TAKE 1 TABLET BY MOUTH AT BEDTIME 12/09/18  Yes [provider]  traMADol (ULTRAM) 50 MG tablet Take 50 mg by mouth every 6 (six) hours as needed (pain).  12/26/18  Yes [provider]  sildenafil (VIAGRA) 50 MG tablet SMARTSIG:1-2 Tablet(s) By Mouth As Needed 04/26/19   [provider]     All other systems have been reviewed and were otherwise negative with the exception of those mentioned in the HPI and as above.  Physical Exam: Vitals:   07/01/19 0559  BP: 127/85  Pulse: 64  Resp: 18  Temp: 97.7 F (36.5 C)  SpO2: 95%    Body mass index is 34.38 kg/m.  General: Alert, no acute distress Cardiovascular: No pedal edema Respiratory: No cyanosis, no use of accessory musculature  Skin: No lesions in the area of chief complaint Neurologic: Sensation intact distally Psychiatric: Patient is competent for consent with normal mood and affect Lymphatic: No axillary or cervical lymphadenopathy  Assessment/Plan: BILATERAL LUMBAR RADICULOPATHY, LUMBAR 5- SACRUM 1 SPONDYLOLISTHESES, NEUROFORAMINAL STENOSIS Plan for Procedure(s): LUMBAR 5 - SACRUM 1 ANTERIOR LUMBAR INTERBODY FUSION WITH INSTRUMENTATION AND ALLOGRAFT LUMBAR 5 - SACRUM 1 POSTERIOR SPINAL FUSION WITH INSTRUMENTATION AND ALLOGRAFT   Norva Karvonen, MD 07/01/2019 6:29 AM

## 2019-07-02 MED ORDER — METHOCARBAMOL 500 MG PO TABS: 500.0000 mg | ORAL_TABLET | Freq: Four times a day (QID) | ORAL | 2 refills | Status: DC | PRN

## 2019-07-02 MED ORDER — OXYCODONE-ACETAMINOPHEN 5-325 MG PO TABS: 1.0000 | ORAL_TABLET | ORAL | 0 refills | Status: DC | PRN

## 2019-07-02 NOTE — Progress Notes (Signed)
  Progress Note    07/02/2019 7:09 AM 1 Day Post-Op  Subjective:  Some burning in feet but this was present before surgery.  Denies rest pain BLE.   Vitals:   07/01/19 1929 07/02/19 0352  BP: 104/63 108/69  Pulse: 88 63  Resp: 20 20  Temp: 99.1 F (37.3 C) 98 F (36.7 C)  SpO2: 95% 98%   Physical Exam: Lungs:  Non labored Incisions:  abd incision c/d/i Extremities:  Palpable and symmetrical DP pulses Neurologic: A&O  CBC    Component Value Date/Time   WBC 7.5 06/28/2019 1200   RBC 4.38 06/28/2019 1200   HGB 15.1 06/28/2019 1200   HCT 44.3 06/28/2019 1200   PLT 268 06/28/2019 1200   MCV 101.1 (H) 06/28/2019 1200   MCH 34.5 (H) 06/28/2019 1200   MCHC 34.1 06/28/2019 1200   RDW 13.3 06/28/2019 1200   LYMPHSABS 3.3 06/28/2019 1200   MONOABS 0.4 06/28/2019 1200   EOSABS 0.1 06/28/2019 1200   BASOSABS 0.0 06/28/2019 1200    BMET    Component Value Date/Time   NA 140 06/28/2019 1200   K 3.8 06/28/2019 1200   CL 104 06/28/2019 1200   CO2 27 06/28/2019 1200   GLUCOSE 120 (H) 06/28/2019 1200   BUN 14 06/28/2019 1200   CREATININE 0.89 06/28/2019 1200   CALCIUM 9.2 06/28/2019 1200   GFRNONAA >60 06/28/2019 1200   GFRAA >60 06/28/2019 1200    INR    Component Value Date/Time   INR 1.0 06/28/2019 1200     Intake/Output Summary (Last 24 hours) at 07/02/2019 8786 Last data filed at 07/01/2019 1800 Gross per 24 hour  Intake 2550 ml  Output 310 ml  Net 2240 ml     Assessment/Plan:  58 y.o. male is s/p anterior exposure for L5-S1 disc fusion 1 Day Post-Op   Perfusing BLE well with palpable DP pulses Incision unremarkable Ok for discharge from vascular surgery standpoint   Dagoberto Ligas, PA-C [facility] [phone] 07/02/2019 7:09 AM

## 2019-07-02 NOTE — Evaluation (Signed)
Occupational Therapy Evaluation and Discharge Patient Details Name: Calvin Marsh MRN: [id number] DOB: 07/17/1960 Today's Date: 07/02/2019    History of Present Illness Pt is a 58 y.o. M with significant PMH of right total knee replacement who presents s/p ALIF L5-S1 07/01/2019.   Clinical Impression   All education completed with daughter interpreting. Daughter and patient verbalizing understanding. No further OT needs.    Follow Up Recommendations  No OT follow up    Equipment Recommendations  3 in 1 bedside commode ,RW   Recommendations for Other Services       Precautions / Restrictions Precautions Precautions: Back Precaution Booklet Issued: Yes (comment) Precaution Comments: Written Spanish spinal precautions provided; pt recalling 2/3 Required Braces or Orthoses: Spinal Brace Spinal Brace: Thoracolumbosacral orthotic;Applied in sitting position;Applied in standing position Restrictions Weight Bearing Restrictions: No      Mobility Bed Mobility Overal bed mobility: Modified Independent             General bed mobility comments: cues for log roll technique, use of bed rail  Transfers Overall transfer level: Needs assistance Equipment used: Rolling walker (2 wheeled) Transfers: Sit to/from Stand Sit to Stand: Modified independent (Device/Increase time)              Balance Overall balance assessment: Mild deficits observed, not formally tested                                         ADL either performed or assessed with clinical judgement   ADL Overall ADL's : Needs assistance/impaired Eating/Feeding: Independent   Grooming: Oral care;Wash/dry face;Supervision/safety;Standing Grooming Details (indicate cue type and reason): educated in two cup method for toothbrushing, use of washcloths for face at sink Upper Body Bathing: Minimal assistance;Sitting Upper Body Bathing Details (indicate cue type and reason): recommended long  handled bath sponge for back and feet Lower Body Bathing: Minimal assistance;Sit to/from stand   Upper Body Dressing : Set up;Sitting Upper Body Dressing Details (indicate cue type and reason): reviewed wearing schedule of back brace Lower Body Dressing: Minimal assistance;Sit to/from stand   Toilet Transfer: Supervision/safety;Ambulation;BSC;RW Toilet Transfer Details (indicate cue type and reason): educated in use of 3 in 1 over toilet Toileting- Clothing Manipulation and Hygiene: Supervision/safety;Sit to/from stand Toileting - Clothing Manipulation Details (indicate cue type and reason): instructed to avoid twisting with pericare, use of tongs as needed    Tub/Shower Transfer Details (indicate cue type and reason): instructed in use of 3 in 1 as a shower seat Functional mobility during ADLs: Supervision/safety;Rolling walker General ADL Comments: Educated in IADL to avoid.     Vision Patient Visual Report: No change from baseline       Perception     Praxis      Pertinent Vitals/Pain Pain Assessment: Faces Faces Pain Scale: Hurts little more Pain Location: surgical site Pain Descriptors / Indicators: Operative site guarding Pain Intervention(s): Monitored during session;Premedicated before session;Repositioned     Hand Dominance Right   Extremity/Trunk Assessment Upper Extremity Assessment Upper Extremity Assessment: Overall WFL for tasks assessed   Lower Extremity Assessment Lower Extremity Assessment: Defer to PT evaluation   Cervical / Trunk Assessment Cervical / Trunk Assessment: Other exceptions Cervical / Trunk Exceptions: s/p spinal sx   Communication Communication Communication: Prefers language other than English   Cognition Arousal/Alertness: Awake/alert Behavior During Therapy: WFL for tasks assessed/performed Overall Cognitive Status: Within Functional Limits for  tasks assessed                                     General Comments        Exercises     Shoulder Instructions      Home Living Family/patient expects to be discharged to:: Private residence Living Arrangements: Children;Spouse/significant other Available Help at Discharge: Family Type of Home: House Home Access: Stairs to enter Technical brewer of Steps: 4 Entrance Stairs-Rails: None Home Layout: One level     Bathroom Shower/Tub: Teacher, early years/pre: [place]: Environmental consultant - 2 wheels          Prior Functioning/Environment Level of Independence: Independent with assistive device(s)        Comments: reports he was using a walker        OT Problem List:        OT Treatment/Interventions:      OT Goals(Current goals can be found in the care plan section) Acute Rehab OT Goals Patient Stated Goal: none stated; agreeable to therapy  OT Frequency:     Barriers to D/C:            Co-evaluation              AM-PAC OT "6 Clicks" Daily Activity     Outcome Measure Help from another person eating meals?: None Help from another person taking care of personal grooming?: A Little Help from another person toileting, which includes using toliet, bedpan, or urinal?: A Little Help from another person bathing (including washing, rinsing, drying)?: A Little Help from another person to put on and taking off regular upper body clothing?: None Help from another person to put on and taking off regular lower body clothing?: A Little 6 Click Score: 20   End of Session    Activity Tolerance: Patient tolerated treatment well Patient left: in bed;with call bell/phone within reach;with family/visitor present  OT Visit Diagnosis: Pain;Other abnormalities of gait and mobility (R26.89)                Time: 7673-4193 OT Time Calculation (min): 20 min Charges:  OT General Charges $OT Visit: 1 Visit OT Evaluation $OT Eval Low Complexity: 1 Low  Calvin Marsh 07/02/2019, 10:04 AM  Calvin Marsh,  OTR/L Acute Rehabilitation Services Pager: [phone] Office: [phone]

## 2019-07-02 NOTE — Evaluation (Signed)
Physical Therapy Evaluation Patient Details Name: Calvin Marsh MRN: [id number] DOB: [birth year] Today's Date: 07/02/2019   History of Present Illness  Pt is a 58 y.o. M with significant PMH of right total knee replacement who presents s/p ALIF L5-S1 07/01/2019.  Clinical Impression  Patient evaluated by Physical Therapy with no further acute PT needs identified. Pt ambulating hallway distances with a walker without physical difficulty. Negotiated 8 steps to prepare for discharge home. Pt daughter present and educated on technique. Additional education provided re: spinal precautions, car transfer technique, generalized walking program and activity recommendations. All education has been completed and the patient has no further questions. See below for any follow-up Physical Therapy or equipment needs. PT is signing off. Thank you for this referral.     Follow Up Recommendations No PT follow up    Equipment Recommendations  3in1 (PT)    Recommendations for Other Services       Precautions / Restrictions Precautions Precautions: Back Precaution Booklet Issued: Yes (comment) Precaution Comments: Written Spanish spinal precautions provided; pt recalling 2/3 Required Braces or Orthoses: Spinal Brace Spinal Brace: Thoracolumbosacral orthotic;Applied in sitting position;Applied in standing position Restrictions Weight Bearing Restrictions: No      Mobility  Bed Mobility Overal bed mobility: Modified Independent             General bed mobility comments: cues for log roll technique, use of bed rail  Transfers Overall transfer level: Needs assistance Equipment used: Rolling walker (2 wheeled) Transfers: Sit to/from Stand Sit to Stand: Modified independent (Device/Increase time)            Ambulation/Gait Ambulation/Gait assistance: Modified independent (Device/Increase time) Gait Distance (Feet): 400 Feet Assistive device: Rolling walker (2 wheeled) Gait  Pattern/deviations: WFL(Within Functional Limits) Gait velocity: decreased   General Gait Details: No apparent gait deficits; cues for scapular depression  Stairs Stairs: Yes Stairs assistance: Min guard Stair Management: One rail Right Number of Stairs: 8 General stair comments: Cues for sequencing; use of rail and handheld assist  Wheelchair Mobility    Modified Rankin (Stroke Patients Only)       Balance Overall balance assessment: Mild deficits observed, not formally tested                                           Pertinent Vitals/Pain Pain Assessment: Faces Faces Pain Scale: Hurts little more Pain Location: surgical site Pain Descriptors / Indicators: Operative site guarding Pain Intervention(s): Monitored during session    Home Living Family/patient expects to be discharged to:: Private residence Living Arrangements: Children(daughter) Available Help at Discharge: Family Type of Home: House Home Access: Stairs to enter Entrance Stairs-Rails: None Secretary/administrator of Steps: 4 Home Layout: One level Home Equipment: Environmental consultant - 2 wheels      Prior Function Level of Independence: Independent with assistive device(s)         Comments: reports he was using a walker     Hand Dominance        Extremity/Trunk Assessment   Upper Extremity Assessment Upper Extremity Assessment: Overall WFL for tasks assessed    Lower Extremity Assessment Lower Extremity Assessment: Overall WFL for tasks assessed    Cervical / Trunk Assessment Cervical / Trunk Assessment: Other exceptions Cervical / Trunk Exceptions: s/p spinal sx  Communication   Communication: Prefers language other than English  Cognition Arousal/Alertness: Awake/alert Behavior During Therapy: [facility] for  tasks assessed/performed Overall Cognitive Status: Within Functional Limits for tasks assessed                                        General Comments       Exercises     Assessment/Plan    PT Assessment Patent does not need any further PT services  PT Problem List Pain;Decreased mobility       PT Treatment Interventions      PT Goals (Current goals can be found in the Care Plan section)  Acute Rehab PT Goals Patient Stated Goal: none stated; agreeable to therapy PT Goal Formulation: All assessment and education complete, DC therapy    Frequency     Barriers to discharge        Co-evaluation               AM-PAC PT "6 Clicks" Mobility  Outcome Measure Help needed turning from your back to your side while in a flat bed without using bedrails?: None Help needed moving from lying on your back to sitting on the side of a flat bed without using bedrails?: None Help needed moving to and from a bed to a chair (including a wheelchair)?: None Help needed standing up from a chair using your arms (e.g., wheelchair or bedside chair)?: None Help needed to walk in hospital room?: None Help needed climbing 3-5 steps with a railing? : A Little 6 Click Score: 23    End of Session Equipment Utilized During Treatment: Gait belt;Back brace Activity Tolerance: Patient tolerated treatment well Patient left: in bed;with call bell/phone within reach;with family/visitor present Nurse Communication: Mobility status PT Visit Diagnosis: Pain Pain - part of body: (back)    Time: 5916-3846 PT Time Calculation (min) (ACUTE ONLY): 22 min   Charges:   PT Evaluation $PT Eval Low Complexity: 1 Low          Ellamae Sia, PT, DPT Acute Rehabilitation Services Pager [phone] Office [phone]   Willy Eddy 07/02/2019, 9:05 AM

## 2019-07-02 NOTE — TOC Transition Note (Signed)
Transition of Care [facility]) - CM/SW Discharge Note   Patient Details  Name: Calvin Marsh MRN: [id number] Date of Birth: 1960/08/20  Transition of Care [facility]) CM/SW Contact:  Sharin Mons, RN Phone Number: 07/02/2019, 9:59 AM   Clinical Narrative:  - s/p A/P L5/S1 fusion procedure, 07/01/2019  Pt will transition to home today. Pt given DME needs, rolling walker , 3 in 1/BSC. Workmen comp. CM  Requested we supply pt with equipment and bill [employer].  Workmen Compensation CM [employer] Wren[employer] [email or web address]. [phone] Claim #[id number] [employer] [address] [place] [address]  Pt states has transportation to home . No other needs noted per NCM.  Final next level of care: Home/Self Care Barriers to Discharge: No Barriers Identified   Patient Goals and CMS Choice        Discharge Placement                       Discharge Plan and Services                                     Social Determinants of Health (SDOH) Interventions     Readmission Risk Interventions No flowsheet data found.

## 2019-07-02 NOTE — Plan of Care (Signed)
Patient alert and oriented, mae's well, voiding adequate amount of urine, swallowing without difficulty, no c/o pain at time of discharge. Patient discharged home with family. Script and discharged instructions given to patient. Patient and family stated understanding of instructions given. Patient has an appointment with Dr. Dumonski 

## 2019-07-02 NOTE — Progress Notes (Signed)
    Patient doing well  Denies leg pain   Physical Exam: Vitals:   07/01/19 1929 07/02/19 0352  BP: 104/63 108/69  Pulse: 88 63  Resp: 20 20  Temp: 99.1 F (37.3 C) 98 F (36.7 C)  SpO2: 95% 98%    Dressing in place NVI  POD #1 s/p A/P L5/S1 fusion procedure, doing well  - up with PT/OT, encourage ambulation - Percocet for pain, Robaxin for muscle spasms - likely d/c home today with f/u in 2 weeks

## 2019-07-06 ENCOUNTER — Encounter: Payer: Self-pay | Admitting: *Deleted

## 2019-07-07 ENCOUNTER — Encounter: Payer: Self-pay | Admitting: *Deleted

## 2019-07-07 MED FILL — Heparin Sodium (Porcine) Inj 1000 Unit/ML: INTRAMUSCULAR | Qty: 30 | Status: AC

## 2019-07-07 MED FILL — Sodium Chloride IV Soln 0.9%: INTRAVENOUS | Qty: 2000 | Status: AC

## 2019-07-21 NOTE — Discharge Summary (Signed)
Patient ID: Calvin Marsh MRN: [id number] DOB/AGE: 1961-07-03 59 y.o.  Admit date: 07/01/2019 Discharge date: 07/02/2019  Admission Diagnoses:  Active Problems:   Radiculopathy   Discharge Diagnoses:  Same  Past Medical History:  Diagnosis Date  . Bilateral lumbar radiculopathy   . Complication of anesthesia   . Low back pain    Left greater than right leg pain  . 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 Spondylolisthesis at L5-S1 level     Surgeries: Procedure(s): LUMBAR 5 - SACRUM 1 ANTERIOR LUMBAR INTERBODY FUSION WITH INSTRUMENTATION AND ALLOGRAFT LUMBAR 5 - SACRUM 1 POSTERIOR SPINAL FUSION WITH INSTRUMENTATION AND ALLOGRAFT on 07/01/2019   Consultants: None  Discharged Condition: Improved  Hospital Course: Calvin Marsh is an 59 y.o. male who was admitted 07/01/2019 for operative treatment of <principal problem not specified>. Patient has severe unremitting pain that affects sleep, daily activities, and work/hobbies. After pre-op clearance the patient was taken to the operating room on 07/01/2019 and underwent  Procedure(s): LUMBAR 5 - SACRUM 1 ANTERIOR LUMBAR INTERBODY FUSION WITH INSTRUMENTATION AND ALLOGRAFT LUMBAR 5 - SACRUM 1 POSTERIOR SPINAL FUSION WITH INSTRUMENTATION AND ALLOGRAFT.    Patient was given perioperative antibiotics:  Anti-infectives (From admission, onward)   Start     Dose/Rate Route Frequency Ordered Stop   07/01/19 1600  ceFAZolin (ANCEF) IVPB 2g/100 mL premix     2 g 200 mL/hr over 30 Minutes Intravenous Every 8 hours 07/01/19 1422 07/01/19 2338   07/01/19 0615  ceFAZolin (ANCEF) IVPB 2g/100 mL premix     2 g 200 mL/hr over 30 Minutes Intravenous On call to O.R. 07/01/19 3710 07/01/19 0815   07/01/19 0609  ceFAZolin (ANCEF) 2-4 GM/100ML-% IVPB    Note to Pharmacy: Clovis Cao   : cabinet override      07/01/19 0609 07/01/19 0858       Patient was given sequential compression devices,  early ambulation to prevent DVT.  Patient benefited maximally from hospital stay and there were no complications.    Recent vital signs: BP 108/68 (BP Location: Right Arm)   Pulse 60   Temp 97.8 F (36.6 C) (Oral)   Resp 18   Ht 5\' 6"  (1.676 m)   Wt 96.6 kg   SpO2 96%   BMI 34.38 kg/m    Discharge Medications:   Allergies as of 07/02/2019   No Known Allergies     Medication List    TAKE these medications   methocarbamol 500 MG tablet Commonly known as: ROBAXIN Take 1 tablet (500 mg total) by mouth every 6 (six) hours as needed for muscle spasms.   oxyCODONE-acetaminophen 5-325 MG tablet Commonly known as: PERCOCET/ROXICET Take 1-2 tablets by mouth every 4 (four) hours as needed for moderate pain or severe pain.   sildenafil 50 MG tablet Commonly known as: VIAGRA SMARTSIG:1-2 Tablet(s) By Mouth As Needed       Diagnostic Studies: DG Lumbar Spine 2-3 Views  Result Date: 07/01/2019 CLINICAL DATA:  Anterior and posterior fusion at L5-S1. EXAM: DG C-ARM 1-60 MIN; LUMBAR SPINE - 2-3 VIEW COMPARISON:  None. FINDINGS: Anterior and posterior and interbody fusion hardware at L5-S1. Good position of the hardware without complicating features. IMPRESSION: L5-S1 fusion. Electronically Signed   By: 07/03/2019 M.D.   On: 07/01/2019 14:24   DG C-Arm 1-60 Min  Result Date: 07/01/2019 CLINICAL DATA:  Anterior and posterior fusion at L5-S1. EXAM: DG C-ARM 1-60 MIN; LUMBAR SPINE -  2-3 VIEW COMPARISON:  None. FINDINGS: Anterior and posterior and interbody fusion hardware at L5-S1. Good position of the hardware without complicating features. IMPRESSION: L5-S1 fusion. Electronically Signed   By: Marijo Sanes M.D.   On: 07/01/2019 14:24   DG OR LOCAL ABDOMEN  Result Date: 07/01/2019 CLINICAL DATA:  Needle count off EXAM: OR LOCAL ABDOMEN COMPARISON:  None FINDINGS: Postop change from lumbosacral posterior hardware fixation and interbody fusion. No radiopaque foreign body identified to  account for the missing needle. IMPRESSION: 1. No radiopaque foreign body identified to account for the missing needle. 2. These results were called by telephone at the time of interpretation on 07/01/2019 at 12:09 pm to the operating room at [phone]. Electronically Signed   By: Kerby Moors M.D.   On: 07/01/2019 12:09   DG OR LOCAL ABDOMEN  Result Date: 07/01/2019 CLINICAL DATA:  Sponge count EXAM: OR LOCAL ABDOMEN COMPARISON:  None. FINDINGS: Per protocol for ALIF. No missing hardware. Alif hardware is present over the lumbosacral junction. No unexpected foreign body. Extraperitoneal gas along the left pelvic sidewall correlating with recent exposure. These results were called by telephone at the time of interpretation on 07/01/2019 at 10:24 am to [place] , who verbally acknowledged these results. IMPRESSION: No unexpected foreign body. Electronically Signed   By: Monte Fantasia M.D.   On: 07/01/2019 10:24    Disposition: Discharge disposition: 01-Home or Self Care        POD #1 s/p A/P L5/S1 fusion procedure, doing well  - up with PT/OT, encourage ambulation - Percocet for pain, Robaxin for muscle spasms -Scripts for pain sent to pharmacy electronically  -D/C instructions sheet printed and in chart -D/C today  -F/U in office 2 weeks   Signed: Justice Britain 07/21/2019, 9:49 AM

## 2019-12-07 ENCOUNTER — Other Ambulatory Visit: Payer: Self-pay | Admitting: Orthopedic Surgery

## 2019-12-15 NOTE — Progress Notes (Signed)
[facility] [address] [place], [place] - [address] [place] [address] Phone: [phone] Fax: [phone]    Your procedure is scheduled on Monday, June 7th.  Report to [facility] Main Entrance "A" at 10:00 A.M., and check in at the Admitting office.  Call this number if you have problems the morning of surgery:  [phone]  Call [phone] if you have any questions prior to your surgery date Monday-Friday 8am-4pm   Remember:  Do not eat after midnight the night before your surgery  You may drink clear liquids until 10:00 A.M. the morning of your surgery.   Clear liquids allowed are: Water, Non-Citrus Juices (without pulp), Carbonated Beverages, Clear Tea, Black Coffee Only, and Gatorade    Enhanced Recovery after Surgery for Orthopedics Enhanced Recovery after Surgery is a protocol used to improve the stress on your body and your recovery after surgery.  Patient Instructions  . The night before surgery:  o No food after midnight. ONLY clear liquids after midnight  .  Marland Kitchen The day of surgery (if you do NOT have diabetes):  o Drink ONE (1) Pre-Surgery Clear Ensure by 10:00 A.M. the morning of surgery.   o This drink was given to you during your hospital  pre-op appointment visit. o Nothing else to drink after completing the  Pre-Surgery Clear Ensure.         If you have questions, please contact your surgeon's office.   Take these medicines the morning of surgery with A SIP OF WATER   If needed - methocarbamol (ROBAXIN), oxyCODONE-acetaminophen (PERCOCET/ROXICET)   As of today, STOP taking any Aspirin (unless otherwise instructed by your surgeon) and Aspirin containing products, Aleve, Naproxen, Ibuprofen, Motrin, Advil, Goody's, BC's, all herbal medications, fish oil, and all vitamins.                     Do not wear jewelry.            Do not wear lotions, powders, colognes, or deodorant.            Men may shave face and neck.             Do not bring valuables to the hospital.            [facility] is not responsible for any belongings or valuables.  Do NOT Smoke (Tobacco/Vapping) or drink Alcohol 24 hours prior to your procedure If you use a CPAP at night, you may bring all equipment for your overnight stay.   Contacts, glasses, dentures or bridgework may not be worn into surgery.      For patients admitted to the hospital, discharge time will be determined by your treatment team.   Patients discharged the day of surgery will not be allowed to drive home, and someone needs to stay with them for 24 hours.  Special instructions:   [facility]- Preparing For Surgery  Before surgery, you can play an important role. Because skin is not sterile, your skin needs to be as free of germs as possible. You can reduce the number of germs on your skin by washing with CHG (chlorahexidine gluconate) Soap before surgery.  CHG is an antiseptic cleaner which kills germs and bonds with the skin to continue killing germs even after washing.   Oral Hygiene is also important to reduce your risk of infection.  Remember - BRUSH YOUR TEETH THE MORNING OF SURGERY WITH YOUR REGULAR TOOTHPASTE  Please  do not use if you have an allergy to CHG or antibacterial soaps. If your skin becomes reddened/irritated stop using the CHG.  Do not shave (including legs and underarms) for at least 48 hours prior to first CHG shower. It is OK to shave your face.  Please follow these instructions carefully.   1. Shower the NIGHT BEFORE SURGERY and the MORNING OF SURGERY with CHG Soap.   2. If you chose to wash your hair, wash your hair first as usual with your normal shampoo.  3. After you shampoo, rinse your hair and body thoroughly to remove the shampoo.  4. Use CHG as you would any other liquid soap. You can apply CHG directly to the skin and wash gently with a scrungie or a clean washcloth.   5. Apply the CHG Soap to your body ONLY FROM THE NECK DOWN.  Do not  use on open wounds or open sores. Avoid contact with your eyes, ears, mouth and genitals (private parts). Wash Face and genitals (private parts)  with your normal soap.   6. Wash thoroughly, paying special attention to the area where your surgery will be performed.  7. Thoroughly rinse your body with warm water from the neck down.  8. DO NOT shower/wash with your normal soap after using and rinsing off the CHG Soap.  9. Pat yourself dry with a CLEAN TOWEL.  10. Wear CLEAN PAJAMAS to bed the night before surgery, wear comfortable clothes the morning of surgery  11. Place CLEAN SHEETS on your bed the night of your first shower and DO NOT SLEEP WITH PETS.  Day of Surgery: Shower with CHG soap as instructed above.  Do not apply any deodorants/lotions.  Please wear clean clothes to the hospital/surgery center.   Remember to brush your teeth WITH YOUR REGULAR TOOTHPASTE.   Please read over the following fact sheets that you were given.

## 2019-12-16 ENCOUNTER — Encounter (HOSPITAL_COMMUNITY)
Admission: RE | Admit: 2019-12-16 | Discharge: 2019-12-16 | Disposition: A | Discharge status: Home / Self Care | Payer: Self-pay | Source: Ambulatory Visit | Attending: Orthopedic Surgery | Admitting: Orthopedic Surgery

## 2019-12-16 ENCOUNTER — Other Ambulatory Visit: Payer: Self-pay

## 2019-12-16 ENCOUNTER — Encounter (HOSPITAL_COMMUNITY): Payer: Self-pay

## 2019-12-16 ENCOUNTER — Other Ambulatory Visit (HOSPITAL_COMMUNITY)
Admission: RE | Admit: 2019-12-16 | Discharge: 2019-12-16 | Disposition: A | Discharge status: Home / Self Care | Payer: Commercial Managed Care - PPO | Source: Ambulatory Visit | Attending: Orthopedic Surgery | Admitting: Orthopedic Surgery

## 2019-12-16 DIAGNOSIS — Z01812 Encounter for preprocedural laboratory examination: Secondary | ICD-10-CM | POA: Insufficient documentation

## 2019-12-16 DIAGNOSIS — Z20822 Contact with and (suspected) exposure to covid-19: Secondary | ICD-10-CM | POA: Insufficient documentation

## 2019-12-16 HISTORY — DX: Unspecified osteoarthritis, unspecified site: M19.90

## 2019-12-16 LAB — COMPREHENSIVE METABOLIC PANEL
ALT: 31 U/L (ref 0–44)
AST: 29 U/L (ref 15–41)
Albumin: 4.4 g/dL (ref 3.5–5.0)
Alkaline Phosphatase: 42 U/L (ref 38–126)
Anion gap: 10 (ref 5–15)
BUN: 11 mg/dL (ref 6–20)
CO2: 26 mmol/L (ref 22–32)
Calcium: 9 mg/dL (ref 8.9–10.3)
Chloride: 103 mmol/L (ref 98–111)
Creatinine, Ser: 0.87 mg/dL (ref 0.61–1.24)
GFR calc Af Amer: >60 mL/min (ref >60)
GFR calc non Af Amer: >60 mL/min (ref >60)
Glucose, Bld: 110 mg/dL — ABNORMAL HIGH (ref 70–99)
Potassium: 4.2 mmol/L (ref 3.5–5.1)
Sodium: 139 mmol/L (ref 135–145)
Total Bilirubin: 0.8 mg/dL (ref 0.3–1.2)
Total Protein: 7.2 g/dL (ref 6.5–8.1)

## 2019-12-16 LAB — CBC WITH DIFFERENTIAL/PLATELET
Abs Immature Granulocytes: 0.02 10*3/uL (ref 0.00–0.07)
Basophils Absolute: 0 10*3/uL (ref 0.0–0.1)
Basophils Relative: 1 %
Eosinophils Absolute: 0.1 10*3/uL (ref 0.0–0.5)
Eosinophils Relative: 1 %
HCT: 44.2 % (ref 39.0–52.0)
Hemoglobin: 15 g/dL (ref 13.0–17.0)
Immature Granulocytes: 0 %
Lymphocytes Relative: 46 %
Lymphs Abs: 2.7 10*3/uL (ref 0.7–4.0)
MCH: 34.2 pg — ABNORMAL HIGH (ref 26.0–34.0)
MCHC: 33.9 g/dL (ref 30.0–36.0)
MCV: 100.9 fL — ABNORMAL HIGH (ref 80.0–100.0)
Monocytes Absolute: 0.3 10*3/uL (ref 0.1–1.0)
Monocytes Relative: 5 %
Neutro Abs: 2.8 10*3/uL (ref 1.7–7.7)
Neutrophils Relative %: 47 %
Platelets: 234 10*3/uL (ref 150–400)
RBC: 4.38 MIL/uL (ref 4.22–5.81)
RDW: 13.2 % (ref 11.5–15.5)
WBC: 5.9 10*3/uL (ref 4.0–10.5)
nRBC: 0 % (ref 0.0–0.2)

## 2019-12-16 LAB — URINALYSIS, ROUTINE W REFLEX MICROSCOPIC
Bilirubin Urine: NEGATIVE
Glucose, UA: NEGATIVE mg/dL
Hgb urine dipstick: NEGATIVE
Ketones, ur: NEGATIVE mg/dL
Leukocytes,Ua: NEGATIVE
Nitrite: NEGATIVE
Protein, ur: NEGATIVE mg/dL
Specific Gravity, Urine: 1.016 (ref 1.005–1.030)
pH: 7 (ref 5.0–8.0)

## 2019-12-16 LAB — SARS CORONAVIRUS 2 (TAT 6-24 HRS): SARS Coronavirus 2: NEGATIVE

## 2019-12-16 LAB — SURGICAL PCR SCREEN
MRSA, PCR: NEGATIVE
Staphylococcus aureus: NEGATIVE

## 2019-12-16 LAB — PROTIME-INR
INR: 1 (ref 0.8–1.2)
Prothrombin Time: 12.8 seconds (ref 11.4–15.2)

## 2019-12-16 LAB — APTT: aPTT: 35 seconds (ref 24–36)

## 2019-12-16 LAB — TYPE AND SCREEN
ABO/RH(D): O POS
Antibody Screen: NEGATIVE

## 2019-12-16 NOTE — Pre-Procedure Instructions (Signed)
Jerrid Forgette  12/16/2019    Your procedure is scheduled on Monday, December 20, 2019 at 1:00 PM.   Report to [facility] Entrance "A" Admitting Office at 10:00 AM.   Call this number if you have problems the morning of surgery: [phone]   Questions prior to day of surgery, please call [phone] between 8 & 4 PM.   Remember:  Do not eat food after midnight Sunday, 12/19/19.  You may drink clear liquids until 10:00 AM.  Clear liquids allowed are: Water, Juice (non-citric and without pulp - diabetics please choose diet or no sugar options), Carbonated beverages - (diabetics please choose diet or no sugar options), Clear Tea, Black Coffee only (no creamer, milk or cream including half and half) and Gatorade (diabetics please choose diet or no sugar options)   Drink the Pre-surgery Ensure just prior to 10:00 AM day of surgery. This will be the last liquids that you will have prior to surgery.    Take these medicines the morning of surgery with A SIP OF WATER: Methocarbamol (Robaxin) - if needed, Oxycodone (Percocet) - if needed    Do not wear jewelry.  Do not wear lotions, powders, cologne or deodorant.             Men may shave face and neck.  Do not bring valuables to the hospital.  [facility] is not responsible for any belongings or valuables.  Contacts, dentures or bridgework may not be worn into surgery.  Leave your suitcase in the car.  After surgery it may be brought to your room.  For patients admitted to the hospital, discharge time will be determined by your treatment team.   [facility] - Preparing for Surgery  Before surgery, you can play an important role.  Because skin is not sterile, your skin needs to be as free of germs as possible.  You can reduce the number of germs on you skin by washing with CHG (chlorahexidine gluconate) soap before surgery.  CHG is an antiseptic cleaner which kills germs and bonds with the skin to continue killing germs even after  washing.  Oral Hygiene is also important in reducing the risk of infection.  Remember to brush your teeth with your regular toothpaste the morning of surgery.  Please DO NOT use if you have an allergy to CHG or antibacterial soaps.  If your skin becomes reddened/irritated stop using the CHG and inform your nurse when you arrive at Short Stay.  Do not shave (including legs and underarms) for at least 48 hours prior to the first CHG shower.  You may shave your face.  Please follow these instructions carefully:   1.  Shower with CHG Soap the night before surgery and the morning of Surgery.  2.  If you choose to wash your hair, wash your hair first as usual with your normal shampoo.  3.  After you shampoo, rinse your hair and body thoroughly to remove the shampoo. 4.  Use CHG as you would any other liquid soap.  You can apply chg directly to the skin and wash gently with a      scrungie or washcloth.           5.  Apply the CHG Soap to your body ONLY FROM THE NECK DOWN.   Do not use on open wounds or open sores. Avoid contact with your eyes, ears, mouth and genitals (private parts).  Wash genitals (private parts) with your normal soap - do  this prior to using CHG soap.  6.  Wash thoroughly, paying special attention to the area where your surgery will be performed.  7.  Thoroughly rinse your body with warm water from the neck down.  8.  DO NOT shower/wash with your normal soap after using and rinsing off the CHG Soap.  9.  Pat yourself dry with a clean towel.            10.  Wear clean pajamas.            11.  Place clean sheets on your bed the night of your first shower and do not sleep with pets.  Day of Surgery  Shower as above. Do not apply any lotions/deodorants the morning of surgery.   Please wear clean clothes to the hospital. Remember to brush your teeth with toothpaste.  Please read over the fact sheets that you were given.

## 2019-12-16 NOTE — Progress Notes (Signed)
PCP - Dr. Carron Brazen Cardiologist - denies  Chest x-ray - N/A EKG - N/A Stress Test - denies ECHO - denies Cardiac Cath - denies  ERAS Protcol - yes PRE-SURGERY Ensure - yes  COVID TEST- today  PAT interview with Spanish interpreter, Pablo Ledger . Pt can speak and understand a little English, but interpreter is very helpful. Interpreter requested for surgeries on June 7 and June 8.    Anesthesia review: No  Patient denies shortness of breath, fever, cough and chest pain at PAT appointment   All instructions explained to the patient, with a verbal understanding of the material. Patient agrees to go over the instructions while at home for a better understanding. Patient also instructed to self quarantine after being tested for COVID-19. The opportunity to ask questions was provided.

## 2019-12-20 ENCOUNTER — Inpatient Hospital Stay (HOSPITAL_COMMUNITY): Admitting: Anesthesiology

## 2019-12-20 ENCOUNTER — Encounter (HOSPITAL_COMMUNITY): Payer: Self-pay | Admitting: Orthopedic Surgery

## 2019-12-20 ENCOUNTER — Inpatient Hospital Stay (HOSPITAL_COMMUNITY)
Admission: RE | Admit: 2019-12-20 | Discharge: 2019-12-22 | DRG: 455 | Disposition: A | Discharge status: Home / Self Care | Attending: Orthopedic Surgery | Admitting: Orthopedic Surgery

## 2019-12-20 ENCOUNTER — Other Ambulatory Visit: Payer: Self-pay

## 2019-12-20 ENCOUNTER — Inpatient Hospital Stay (HOSPITAL_COMMUNITY)

## 2019-12-20 ENCOUNTER — Inpatient Hospital Stay (HOSPITAL_COMMUNITY)
Admission: RE | Disposition: A | Discharge status: Home / Self Care | Payer: Self-pay | Source: Home / Self Care | Attending: Orthopedic Surgery

## 2019-12-20 DIAGNOSIS — Z6835 Body mass index (BMI) 35.0-35.9, adult: Secondary | ICD-10-CM

## 2019-12-20 DIAGNOSIS — M48061 Spinal stenosis, lumbar region without neurogenic claudication: Principal | ICD-10-CM | POA: Diagnosis present

## 2019-12-20 DIAGNOSIS — M4316 Spondylolisthesis, lumbar region: Secondary | ICD-10-CM | POA: Diagnosis present

## 2019-12-20 DIAGNOSIS — Z981 Arthrodesis status: Secondary | ICD-10-CM

## 2019-12-20 DIAGNOSIS — M5410 Radiculopathy, site unspecified: Secondary | ICD-10-CM | POA: Diagnosis present

## 2019-12-20 DIAGNOSIS — Z419 Encounter for procedure for purposes other than remedying health state, unspecified: Secondary | ICD-10-CM

## 2019-12-20 DIAGNOSIS — M5416 Radiculopathy, lumbar region: Secondary | ICD-10-CM | POA: Diagnosis present

## 2019-12-20 DIAGNOSIS — Z96651 Presence of right artificial knee joint: Secondary | ICD-10-CM | POA: Diagnosis present

## 2019-12-20 DIAGNOSIS — Z87891 Personal history of nicotine dependence: Secondary | ICD-10-CM

## 2019-12-20 HISTORY — PX: TRANSFORAMINAL LUMBAR INTERBODY FUSION (TLIF) WITH PEDICLE SCREW FIXATION 2 LEVEL: SHX6142

## 2019-12-20 SURGERY — TRANSFORAMINAL LUMBAR INTERBODY FUSION (TLIF) WITH PEDICLE SCREW FIXATION 2 LEVEL
Anesthesia: General | Laterality: Left

## 2019-12-20 MED ORDER — MIDAZOLAM HCL 5 MG/5ML IJ SOLN
INTRAMUSCULAR | Status: DC | PRN
  Administered 2019-12-20: 2 mg via INTRAVENOUS

## 2019-12-20 MED ORDER — DEXAMETHASONE SODIUM PHOSPHATE 10 MG/ML IJ SOLN
INTRAMUSCULAR | Status: DC | PRN
  Administered 2019-12-20: 10 mg via INTRAVENOUS

## 2019-12-20 MED ORDER — OXYCODONE-ACETAMINOPHEN 5-325 MG PO TABS
1.0000 | ORAL_TABLET | ORAL | Status: DC | PRN
  Administered 2019-12-20 – 2019-12-22 (×8): 2 via ORAL
  Filled 2019-12-20 (×7): qty 2

## 2019-12-20 MED ORDER — HYDROMORPHONE HCL 1 MG/ML IJ SOLN
INTRAMUSCULAR | Status: AC
  Filled 2019-12-20: qty 1

## 2019-12-20 MED ORDER — CHLORHEXIDINE GLUCONATE 0.12 % MT SOLN: 15.0000 mL | Freq: Once | OROMUCOSAL | Status: AC

## 2019-12-20 MED ORDER — METHOCARBAMOL 500 MG PO TABS
ORAL_TABLET | ORAL | Status: AC
  Filled 2019-12-20: qty 1

## 2019-12-20 MED ORDER — FENTANYL CITRATE (PF) 250 MCG/5ML IJ SOLN
INTRAMUSCULAR | Status: DC | PRN
  Administered 2019-12-20: 50 ug via INTRAVENOUS
  Administered 2019-12-20: 100 ug via INTRAVENOUS
  Administered 2019-12-20: 50 ug via INTRAVENOUS

## 2019-12-20 MED ORDER — BUPIVACAINE HCL (PF) 0.25 % IJ SOLN
INTRAMUSCULAR | Status: DC | PRN
  Administered 2019-12-20: 5 mL

## 2019-12-20 MED ORDER — CHLORHEXIDINE GLUCONATE 0.12 % MT SOLN
OROMUCOSAL | Status: AC
  Administered 2019-12-20: 15 mL via OROMUCOSAL
  Filled 2019-12-20: qty 15

## 2019-12-20 MED ORDER — HYDROMORPHONE HCL 1 MG/ML IJ SOLN
0.2500 mg | INTRAMUSCULAR | Status: DC | PRN
  Administered 2019-12-20 (×4): 0.5 mg via INTRAVENOUS

## 2019-12-20 MED ORDER — PROPOFOL 10 MG/ML IV BOLUS
INTRAVENOUS | Status: DC | PRN
  Administered 2019-12-20: 50 mg via INTRAVENOUS
  Administered 2019-12-20: 150 mg via INTRAVENOUS

## 2019-12-20 MED ORDER — ENSURE PRE-SURGERY PO LIQD
296.0000 mL | Freq: Once | ORAL | Status: AC
  Administered 2019-12-21: 296 mL via ORAL
  Filled 2019-12-20: qty 296

## 2019-12-20 MED ORDER — ENSURE PRE-SURGERY PO LIQD
296.0000 mL | Freq: Once | ORAL | Status: DC
  Filled 2019-12-20: qty 296

## 2019-12-20 MED ORDER — EPHEDRINE SULFATE-NACL 50-0.9 MG/10ML-% IV SOSY
PREFILLED_SYRINGE | INTRAVENOUS | Status: DC | PRN
  Administered 2019-12-20: 10 mg via INTRAVENOUS

## 2019-12-20 MED ORDER — ONDANSETRON HCL 4 MG/2ML IJ SOLN
INTRAMUSCULAR | Status: DC | PRN
  Administered 2019-12-20: 4 mg via INTRAVENOUS

## 2019-12-20 MED ORDER — PROPOFOL 10 MG/ML IV BOLUS
INTRAVENOUS | Status: AC
  Filled 2019-12-20: qty 20

## 2019-12-20 MED ORDER — FENTANYL CITRATE (PF) 250 MCG/5ML IJ SOLN
INTRAMUSCULAR | Status: AC
  Filled 2019-12-20: qty 5

## 2019-12-20 MED ORDER — DEXAMETHASONE SODIUM PHOSPHATE 10 MG/ML IJ SOLN
INTRAMUSCULAR | Status: AC
  Filled 2019-12-20: qty 1

## 2019-12-20 MED ORDER — SENNOSIDES-DOCUSATE SODIUM 8.6-50 MG PO TABS: 1.0000 | ORAL_TABLET | Freq: Every evening | ORAL | Status: DC | PRN

## 2019-12-20 MED ORDER — ONDANSETRON HCL 4 MG/2ML IJ SOLN: 4.0000 mg | Freq: Four times a day (QID) | INTRAMUSCULAR | Status: DC | PRN

## 2019-12-20 MED ORDER — SODIUM CHLORIDE 0.9% FLUSH: 3.0000 mL | INTRAVENOUS | Status: DC | PRN

## 2019-12-20 MED ORDER — MENTHOL 3 MG MT LOZG: 1.0000 | LOZENGE | OROMUCOSAL | Status: DC | PRN

## 2019-12-20 MED ORDER — OXYCODONE-ACETAMINOPHEN 5-325 MG PO TABS
ORAL_TABLET | ORAL | Status: AC
  Filled 2019-12-20: qty 2

## 2019-12-20 MED ORDER — LIDOCAINE 2% (20 MG/ML) 5 ML SYRINGE
INTRAMUSCULAR | Status: AC
  Filled 2019-12-20: qty 5

## 2019-12-20 MED ORDER — LIDOCAINE 2% (20 MG/ML) 5 ML SYRINGE
INTRAMUSCULAR | Status: DC | PRN
  Administered 2019-12-20: 60 mg via INTRAVENOUS

## 2019-12-20 MED ORDER — EPINEPHRINE PF 1 MG/ML IJ SOLN
INTRAMUSCULAR | Status: AC
  Filled 2019-12-20: qty 1

## 2019-12-20 MED ORDER — TRAZODONE HCL 50 MG PO TABS
50.0000 mg | ORAL_TABLET | Freq: Every evening | ORAL | Status: DC | PRN
  Filled 2019-12-20: qty 1

## 2019-12-20 MED ORDER — ACETAMINOPHEN 500 MG PO TABS: 1000.0000 mg | ORAL_TABLET | Freq: Once | ORAL | Status: DC

## 2019-12-20 MED ORDER — ACETAMINOPHEN 650 MG RE SUPP: 650.0000 mg | RECTAL | Status: DC | PRN

## 2019-12-20 MED ORDER — SODIUM CHLORIDE 0.9% FLUSH
3.0000 mL | Freq: Two times a day (BID) | INTRAVENOUS | Status: DC
  Administered 2019-12-21: 3 mL via INTRAVENOUS

## 2019-12-20 MED ORDER — ONDANSETRON HCL 4 MG/2ML IJ SOLN
INTRAMUSCULAR | Status: AC
  Filled 2019-12-20: qty 2

## 2019-12-20 MED ORDER — DOCUSATE SODIUM 100 MG PO CAPS
100.0000 mg | ORAL_CAPSULE | Freq: Two times a day (BID) | ORAL | Status: DC
  Administered 2019-12-20 – 2019-12-22 (×3): 100 mg via ORAL
  Filled 2019-12-20 (×3): qty 1

## 2019-12-20 MED ORDER — BISACODYL 5 MG PO TBEC
5.0000 mg | DELAYED_RELEASE_TABLET | Freq: Every day | ORAL | Status: DC | PRN
  Administered 2019-12-22: 5 mg via ORAL
  Filled 2019-12-20: qty 1

## 2019-12-20 MED ORDER — MORPHINE SULFATE (PF) 2 MG/ML IV SOLN
1.0000 mg | INTRAVENOUS | Status: DC | PRN
  Administered 2019-12-20 – 2019-12-21 (×2): 2 mg via INTRAVENOUS
  Filled 2019-12-20 (×2): qty 1

## 2019-12-20 MED ORDER — ORAL CARE MOUTH RINSE: 15.0000 mL | Freq: Once | OROMUCOSAL | Status: AC

## 2019-12-20 MED ORDER — ZOLPIDEM TARTRATE 5 MG PO TABS: 5.0000 mg | ORAL_TABLET | Freq: Every evening | ORAL | Status: DC | PRN

## 2019-12-20 MED ORDER — POTASSIUM CHLORIDE IN NACL 20-0.9 MEQ/L-% IV SOLN: INTRAVENOUS | Status: DC

## 2019-12-20 MED ORDER — SUCCINYLCHOLINE CHLORIDE 200 MG/10ML IV SOSY
PREFILLED_SYRINGE | INTRAVENOUS | Status: DC | PRN
  Administered 2019-12-20: 120 mg via INTRAVENOUS

## 2019-12-20 MED ORDER — POVIDONE-IODINE 7.5 % EX SOLN
Freq: Once | CUTANEOUS | Status: DC
  Filled 2019-12-20: qty 118

## 2019-12-20 MED ORDER — MIDAZOLAM HCL 2 MG/2ML IJ SOLN
INTRAMUSCULAR | Status: AC
  Filled 2019-12-20: qty 2

## 2019-12-20 MED ORDER — THROMBIN 20000 UNITS EX SOLR
CUTANEOUS | Status: DC | PRN
  Administered 2019-12-20: 20000 [IU] via TOPICAL

## 2019-12-20 MED ORDER — ALUM & MAG HYDROXIDE-SIMETH 200-200-20 MG/5ML PO SUSP: 30.0000 mL | Freq: Four times a day (QID) | ORAL | Status: DC | PRN

## 2019-12-20 MED ORDER — HEMOSTATIC AGENTS (NO CHARGE) OPTIME
TOPICAL | Status: DC | PRN
  Administered 2019-12-20: 1 via TOPICAL

## 2019-12-20 MED ORDER — PHENYLEPHRINE 40 MCG/ML (10ML) SYRINGE FOR IV PUSH (FOR BLOOD PRESSURE SUPPORT)
PREFILLED_SYRINGE | INTRAVENOUS | Status: DC | PRN
  Administered 2019-12-20 (×3): 80 ug via INTRAVENOUS

## 2019-12-20 MED ORDER — ONDANSETRON HCL 4 MG PO TABS: 4.0000 mg | ORAL_TABLET | Freq: Four times a day (QID) | ORAL | Status: DC | PRN

## 2019-12-20 MED ORDER — PHENYLEPHRINE HCL-NACL 10-0.9 MG/250ML-% IV SOLN
INTRAVENOUS | Status: DC | PRN
  Administered 2019-12-20: 30 ug/min via INTRAVENOUS

## 2019-12-20 MED ORDER — LACTATED RINGERS IV SOLN
INTRAVENOUS | Status: DC | PRN
Start: 2019-12-20 — End: 2019-12-20

## 2019-12-20 MED ORDER — METHYLENE BLUE 0.5 % INJ SOLN
INTRAVENOUS | Status: AC
  Filled 2019-12-20: qty 10

## 2019-12-20 MED ORDER — PHENOL 1.4 % MT LIQD: 1.0000 | OROMUCOSAL | Status: DC | PRN

## 2019-12-20 MED ORDER — THROMBIN (RECOMBINANT) 20000 UNITS EX SOLR
CUTANEOUS | Status: AC
  Filled 2019-12-20: qty 20000

## 2019-12-20 MED ORDER — PROPOFOL 500 MG/50ML IV EMUL
INTRAVENOUS | Status: DC | PRN
  Administered 2019-12-20: 75 ug/kg/min via INTRAVENOUS

## 2019-12-20 MED ORDER — CEFAZOLIN SODIUM-DEXTROSE 2-4 GM/100ML-% IV SOLN
2.0000 g | INTRAVENOUS | Status: DC
  Filled 2019-12-20: qty 100

## 2019-12-20 MED ORDER — METHOCARBAMOL 500 MG PO TABS
500.0000 mg | ORAL_TABLET | Freq: Four times a day (QID) | ORAL | Status: DC | PRN
  Administered 2019-12-20 – 2019-12-22 (×4): 500 mg via ORAL
  Filled 2019-12-20 (×3): qty 1

## 2019-12-20 MED ORDER — BUPIVACAINE HCL (PF) 0.25 % IJ SOLN
INTRAMUSCULAR | Status: AC
  Filled 2019-12-20: qty 30

## 2019-12-20 MED ORDER — ACETAMINOPHEN 325 MG PO TABS: 650.0000 mg | ORAL_TABLET | ORAL | Status: DC | PRN

## 2019-12-20 MED ORDER — SODIUM CHLORIDE 0.9 % IV SOLN: 250.0000 mL | INTRAVENOUS | Status: DC

## 2019-12-20 MED ORDER — EPINEPHRINE PF 1 MG/ML IJ SOLN
INTRAMUSCULAR | Status: DC | PRN
  Administered 2019-12-20: .15 mL

## 2019-12-20 MED ORDER — CEFAZOLIN SODIUM-DEXTROSE 2-4 GM/100ML-% IV SOLN
2.0000 g | INTRAVENOUS | Status: AC
  Administered 2019-12-20: 2 g via INTRAVENOUS
  Filled 2019-12-20: qty 100

## 2019-12-20 MED ORDER — 0.9 % SODIUM CHLORIDE (POUR BTL) OPTIME
TOPICAL | Status: DC | PRN
  Administered 2019-12-20: 1000 mL

## 2019-12-20 MED ORDER — ACETAMINOPHEN 10 MG/ML IV SOLN
INTRAVENOUS | Status: DC | PRN
  Administered 2019-12-20: 1000 mg via INTRAVENOUS

## 2019-12-20 MED ORDER — CEFAZOLIN SODIUM-DEXTROSE 2-4 GM/100ML-% IV SOLN
2.0000 g | Freq: Three times a day (TID) | INTRAVENOUS | Status: AC
  Administered 2019-12-20 – 2019-12-21 (×2): 2 g via INTRAVENOUS
  Filled 2019-12-20: qty 100

## 2019-12-20 MED ORDER — ACETAMINOPHEN 10 MG/ML IV SOLN
INTRAVENOUS | Status: AC
  Filled 2019-12-20: qty 100

## 2019-12-20 MED ORDER — FLEET ENEMA 7-19 GM/118ML RE ENEM: 1.0000 | ENEMA | Freq: Once | RECTAL | Status: DC | PRN

## 2019-12-20 SURGICAL SUPPLY — 69 items
BENZOIN TINCTURE PRP APPL 2/3 (GAUZE/BANDAGES/DRESSINGS) ×3 IMPLANT
BLADE CLIPPER SURG (BLADE) ×2 IMPLANT
BONE VIVIGEN FORMABLE 10CC (Bone Implant) ×6 IMPLANT
CAGE COROENT XL 14X18X55-10 (Cage) ×1 IMPLANT
CAGE COROENT XL 14X18X55MM-10 (Cage) ×1 IMPLANT
CLOSURE WOUND 1/2 X4 (GAUZE/BANDAGES/DRESSINGS) ×1
COVER SURGICAL LIGHT HANDLE (MISCELLANEOUS) ×3 IMPLANT
DRAPE C-ARM 42X72 X-RAY (DRAPES) ×3 IMPLANT
DRAPE C-ARMOR (DRAPES) ×2 IMPLANT
DRAPE POUCH INSTRU U-SHP 10X18 (DRAPES) ×3 IMPLANT
DRAPE SURG 17X23 STRL (DRAPES) ×12 IMPLANT
DURAPREP 26ML APPLICATOR (WOUND CARE) ×3 IMPLANT
ELECT BLADE 4.0 EZ CLEAN MEGAD (MISCELLANEOUS) ×3
ELECT CAUTERY BLADE 6.4 (BLADE) ×3 IMPLANT
ELECT REM PT RETURN 9FT ADLT (ELECTROSURGICAL) ×3
ELECTRODE BLDE 4.0 EZ CLN MEGD (MISCELLANEOUS) ×1 IMPLANT
ELECTRODE REM PT RTRN 9FT ADLT (ELECTROSURGICAL) ×1 IMPLANT
GAUZE 4X4 16PLY RFD (DISPOSABLE) ×3 IMPLANT
GAUZE SPONGE 4X4 12PLY STRL (GAUZE/BANDAGES/DRESSINGS) ×3 IMPLANT
GLOVE BIO SURGEON STRL SZ7 (GLOVE) ×5 IMPLANT
GLOVE BIO SURGEON STRL SZ8 (GLOVE) ×3 IMPLANT
GLOVE BIOGEL PI IND STRL 7.0 (GLOVE) ×1 IMPLANT
GLOVE BIOGEL PI IND STRL 8 (GLOVE) ×1 IMPLANT
GLOVE BIOGEL PI INDICATOR 7.0 (GLOVE) ×2
GLOVE BIOGEL PI INDICATOR 8 (GLOVE) ×6
GLOVE ECLIPSE 8.0 STRL XLNG CF (GLOVE) ×2 IMPLANT
GOWN STRL REUS W/ TWL LRG LVL3 (GOWN DISPOSABLE) ×2 IMPLANT
GOWN STRL REUS W/ TWL XL LVL3 (GOWN DISPOSABLE) ×1 IMPLANT
GOWN STRL REUS W/TWL LRG LVL3 (GOWN DISPOSABLE) ×4
GOWN STRL REUS W/TWL XL LVL3 (GOWN DISPOSABLE) ×2
GRAFT BNE MATRIX VG FRMBL L 10 (Bone Implant) IMPLANT
KIT BASIN OR (CUSTOM PROCEDURE TRAY) ×3 IMPLANT
KIT DILATOR XLIF 5 (KITS) IMPLANT
KIT POSITION SURG JACKSON T1 (MISCELLANEOUS) ×3 IMPLANT
KIT SURGICAL ACCESS MAXCESS 4 (KITS) ×2 IMPLANT
KIT TURNOVER KIT B (KITS) ×3 IMPLANT
KIT XLIF (KITS) ×2
MARKER SKIN DUAL TIP RULER LAB (MISCELLANEOUS) ×6 IMPLANT
MODULE EMG NDL SSEP NVM5 (NEEDLE) IMPLANT
MODULE EMG NEEDLE SSEP NVM5 (NEEDLE) ×3 IMPLANT
MODULE NVM5 NEXT GEN EMG (NEEDLE) ×2 IMPLANT
NDL HYPO 25GX1X1/2 BEV (NEEDLE) ×1 IMPLANT
NDL SPNL 18GX3.5 QUINCKE PK (NEEDLE) ×2 IMPLANT
NEEDLE 22X1 1/2 (OR ONLY) (NEEDLE) ×6 IMPLANT
NEEDLE HYPO 25GX1X1/2 BEV (NEEDLE) ×3 IMPLANT
NEEDLE SPNL 18GX3.5 QUINCKE PK (NEEDLE) ×6 IMPLANT
NS IRRIG 1000ML POUR BTL (IV SOLUTION) ×3 IMPLANT
PACK LAMINECTOMY ORTHO (CUSTOM PROCEDURE TRAY) ×3 IMPLANT
PACK UNIVERSAL I (CUSTOM PROCEDURE TRAY) ×3 IMPLANT
PAD ARMBOARD 7.5X6 YLW CONV (MISCELLANEOUS) ×6 IMPLANT
PATTIES SURGICAL .5 X1 (DISPOSABLE) ×3 IMPLANT
PATTIES SURGICAL .5X1.5 (GAUZE/BANDAGES/DRESSINGS) ×3 IMPLANT
SPACER COROENT XL 16X18X55 10D (Spacer) ×2 IMPLANT
SPONGE INTESTINAL PEANUT (DISPOSABLE) ×3 IMPLANT
SPONGE SURGIFOAM ABS GEL 100 (HEMOSTASIS) ×3 IMPLANT
STRIP CLOSURE SKIN 1/2X4 (GAUZE/BANDAGES/DRESSINGS) ×3 IMPLANT
SURGIFLO W/THROMBIN 8M KIT (HEMOSTASIS) IMPLANT
SUT MNCRL AB 4-0 PS2 18 (SUTURE) ×3 IMPLANT
SUT VIC AB 0 CT1 18XCR BRD 8 (SUTURE) ×1 IMPLANT
SUT VIC AB 0 CT1 8-18 (SUTURE) ×2
SUT VIC AB 1 CT1 18XCR BRD 8 (SUTURE) ×1 IMPLANT
SUT VIC AB 1 CT1 8-18 (SUTURE) ×2
SUT VIC AB 2-0 CT2 18 VCP726D (SUTURE) ×5 IMPLANT
SYR BULB IRRIG 60ML STRL (SYRINGE) ×3 IMPLANT
SYR CONTROL 10ML LL (SYRINGE) ×6 IMPLANT
SYR TB 1ML LUER SLIP (SYRINGE) ×3 IMPLANT
TAPE CLOTH SURG 4X10 WHT LF (GAUZE/BANDAGES/DRESSINGS) ×2 IMPLANT
WATER STERILE IRR 1000ML POUR (IV SOLUTION) ×3 IMPLANT
YANKAUER SUCT BULB TIP NO VENT (SUCTIONS) ×3 IMPLANT

## 2019-12-20 NOTE — H&P (Signed)
PREOPERATIVE H&P  Chief Complaint: Low back pain, left leg pain  HPI: Calvin Marsh is a 59 y.o. male who presents with ongoing pain in the low back and bilateral legs, left greater than right.  The patient has had an EMG/NCS, notable for lumbar radiculopathy. Ongoing denervation was also noted on his EMG/NCS.The patient is status post an L5-S1 fusion, which he did very well from.  The patient's walking tolerance has become very diminished.he does report weakness in his left leg as well.  MRI reveals moderate spinal stenosis at L3-L4 and L4-L5, likely explaining the patient's lower extremity pain.a spondylolisthesis is also noted at L3-L4.  Patient has failed multiple forms of conservative care and continues to have pain (see office notes for additional details regarding the patient's full course of treatment)  Past Medical History:  Diagnosis Date   Arthritis    Bilateral lumbar radiculopathy    Low back pain    Left greater than right leg pain   Neuroforaminal stenosis of lumbar spine    L5-S1.  Severe, bilateral   Spondylolisthesis at L5-S1 level    Past Surgical History:  Procedure Laterality Date   ANTERIOR LUMBAR FUSION Bilateral 07/01/2019   Procedure: LUMBAR 5 - SACRUM 1 ANTERIOR LUMBAR INTERBODY FUSION WITH INSTRUMENTATION AND ALLOGRAFT;  Surgeon: [name], MD;  Location: [facility] OR;  Service: Orthopedics;  Laterality: Bilateral;   REPLACEMENT TOTAL KNEE Right    Social History   Socioeconomic History   Marital status: Married    Spouse name: Not on file   Number of children: Not on file   Years of education: Not on file   Highest education level: Not on file  Occupational History   Occupation: Gaffer  Tobacco Use   Smoking status: Former Smoker    Quit date: 12/16/2014    Years since quitting: 5.0   Smokeless tobacco: Never Used  Substance and Sexual Activity   Alcohol use: Yes    Comment: occasional   Drug use: Never    Sexual activity: Not on file  Other Topics Concern   Not on file  Social History Narrative   Not on file   Social Determinants of Health   Financial Resource Strain:    Difficulty of Paying Living Expenses:   Food Insecurity:    Worried About Programme researcher, broadcasting/film/video in the Last Year:    Barista in the Last Year:   Transportation Needs:    Freight forwarder (Medical):    Lack of Transportation (Non-Medical):   Physical Activity:    Days of Exercise per Week:    Minutes of Exercise per Session:   Stress:    Feeling of Stress :   Social Connections:    Frequency of Communication with Friends and Family:    Frequency of Social Gatherings with Friends and Family:    Attends Religious Services:    Active Member of Clubs or Organizations:    Attends Banker Meetings:    Marital Status:    No family history on file. No Known Allergies Prior to Admission medications   Medication Sig Start Date End Date Taking? Authorizing Provider  methocarbamol (ROBAXIN) 500 MG tablet Take 1 tablet (500 mg total) by mouth every 6 (six) hours as needed for muscle spasms. 07/02/19  Yes [name], MD  oxyCODONE-acetaminophen (PERCOCET/ROXICET) 5-325 MG tablet Take 1-2 tablets by mouth every 4 (four) hours as needed for moderate pain or severe pain. 07/02/19  Yes Phylliss Bob, MD  traZODone (DESYREL) 50 MG tablet Take 50 mg by mouth at bedtime as needed for sleep.   Yes [provider]     All other systems have been reviewed and were otherwise negative with the exception of those mentioned in the HPI and as above.  Physical Exam: There were no vitals filed for this visit.  There is no height or weight on file to calculate BMI.  General: Alert, no acute distress Cardiovascular: No pedal edema Respiratory: No cyanosis, no use of accessory musculature Skin: No lesions in the area of chief complaint Neurologic: Sensation intact  distally Psychiatric: Patient is competent for consent with normal mood and affect Lymphatic: No axillary or cervical lymphadenopathy   Assessment/Plan: MODERATE SPINAL STENOSIS AT LUMBAR 3-4 AND LUMBAR 4-5 S/P A PREVIOUS L5/S1 FUSION Plan for Procedure(s): LEFT LATERAL LUMBAR 3- LUMBAR 4, LUMBAR 4 - LUMBAR 5 INTERBODY FUSION WITH INSTRUMENTATION AND ALLOGRAFT   Norva Karvonen, MD 12/20/2019 7:08 AM

## 2019-12-20 NOTE — Anesthesia Procedure Notes (Signed)
Procedure Name: Intubation Date/Time: 12/20/2019 2:45 PM Performed by: Trinna Post., CRNA Pre-anesthesia Checklist: Patient identified, Emergency Drugs available, Suction available, Patient being monitored and Timeout performed Patient Re-evaluated:Patient Re-evaluated prior to induction Oxygen Delivery Method: Circle system utilized Preoxygenation: Pre-oxygenation with 100% oxygen Induction Type: IV induction and Rapid sequence Ventilation: Mask ventilation without difficulty Laryngoscope Size: Mac and 4 Grade View: Grade I Tube type: Oral Tube size: 8.0 mm Number of attempts: 1 Airway Equipment and Method: Stylet Placement Confirmation: positive ETCO2,  ETT inserted through vocal cords under direct vision and breath sounds checked- equal and bilateral Secured at: 22 cm Tube secured with: Tape Dental Injury: Teeth and Oropharynx as per pre-operative assessment

## 2019-12-20 NOTE — Op Note (Signed)
PATIENT NAME: Calvin Marsh   MEDICAL RECORD NO.:   [id number]    DATE OF BIRTH: [birth year]   DATE OF PROCEDURE: 12/20/2019                               OPERATIVE REPORT     PREOPERATIVE DIAGNOSES: 1.  Low back pain, bilateral leg pain 2.  Spinal stenosis L3-4, L4-5 3.  Status post previous L5-S1 anterior/posterior fusion 4.  Intraoperative use of fluoroscopy    POSTOPERATIVE DIAGNOSES: 1.  Low back pain, bilateral leg pain 2.  Spinal stenosis L3-4, L4-5 3.  Status post previous L5-S1 anterior/posterior fusion 4.  Intraoperative use of fluoroscopy   PROCEDURE: 1. Direct lateral interbody fusion, via a left-sided approach, L3/4,     L4/5. 2. Insertion of interbody device x2 (16 and 39mm NuVasive intervertebral spacers). 3. Intraoperative use of fluoroscopy. 4. Use of morselized allograft - Vivigen   SURGEON:  [name], MD.   ASSISTANTJason Coop, PA-C.   ANESTHESIA:  General endotracheal anesthesia.   COMPLICATIONS:  None.   DISPOSITION:  Stable.   ESTIMATED BLOOD LOSS:  Minimal.   INDICATIONS FOR SURGERY:  Briefly, Mr. Potvin is a pleasant 59 year old male who is status post an L5-S1 fusion procedure.  The patient did very well from surgery.  He did however develop pain in his back and bilateral legs.  An MRI did reveal moderate spinal stenosis at L3-4 and L4-5, with instability also noted on x-rays.  I did feel that the findings on his imaging studies did correlate to his symptoms.  He did wish to proceed with surgical intervention, after appropriate nonoperative measures.  We did therefore discuss proceeding with 2-staged procedure.  Specifically, a lateral fusion, to be followed by a posterior fusion with revision instrumentation.  The patient did fully understand the risks and limitations of surgery and did wish to proceed.    OPERATIVE DETAILS:  On 12/20/2019, the patient was brought to surgery and general endotracheal anesthesia was administered.   The patient was placed in the lateral decubitus position with the left side up.  The patient's hips and knees were flexed and she was secured to the bed. Her left arm was placed on an arm board.  All bony prominences were meticulously padded.  The bed was appropriately angled in order to ensure a perfect AP and lateral trajectory during surgery.  The patient's left flank was then prepped and draped in the usual sterile fashion.  A time-out procedure was performed.    Antibiotics were administered by the anesthesia.  At this point, a left-sided incision was made between the L3-4 and L4-5 intervertebral spaces.  The external and internal oblique musculature was dissected, and the transversalis fascia was entered, and the retroperitoneal space was noted.  The peritoneum was bluntly swept anteriorly.  I then was able to identify the psoas muscle.  I did use the initial dilator, which was advanced through the psoas muscle and over the L4-5 intervertebral space, liberally using neurologic monitoring.  There were no neurologic structures in the immediate vicinity of the dilator.  I then sequentially dilated and placed a self-retaining retractor, which was secured to the bed using a rigid arm.  The retractor was slightly opened.  Again, using triggered EMG, it was clear that there were no neurologic structures in the immediate vicinity of the intervertebral disk space.  At this point, I performed an annulotomy and I  did release the contralateral annulus.  I then performed a thorough and complete L4-5 intervertebral diskectomy.   I then placed a series of trials.  I did ultimately elect to place a 16 x 55 x 18 mm implant.  The implant was liberally packed with ViviGen and was tamped into position.    I was very pleased with the final resting position of the implant. The self-retaining retractor was then removed.  I then again entered through the psoas musculature, and docked over the L3-4  intervertebral space.   I then sequentially dilated and placed a self- retaining retractor.  Again, I did liberally perform EMG testing, and there were no neurologic structures noted in the immediate vicinity of the retractor.  I then again performed annulotomy and then performed a thorough and complete L3-4 intervertebral diskectomy.  Once again, the contralateral annulus was released.  I then placed a series of trials and I did ultimately elect to place a 14 x 55 x 18 mm implant, after packing it liberally with ViviGen.  I was very pleased with the press-fit of the implant.  I was very pleased with the final AP and lateral fluoroscopic images.   The wound was then copiously irrigated.  The wound was then closed in layers using #1 Vicryl, followed by 2-0 Vicryl, followed by 4- 0 Monocryl.  Benzoin and Steri-Strips were applied followed by a sterile dressing.  All instrument counts were correct at the termination of the procedure.   Of note, Pricilla Holm, was my assistant throughout surgery, and did aid in retraction, suctioning, and closure from start to finish.     Phylliss Bob, MD

## 2019-12-20 NOTE — Transfer of Care (Signed)
Immediate Anesthesia Transfer of Care Note  Patient: Calvin Marsh  Procedure(s) Performed: LEFT LATERAL LUMBAR 3- LUMBAR 4, LUMBAR 4 - LUMBAR 5 INTERBODY FUSION WITH INSTRUMENTATION AND ALLOGRAFT (Left )  Patient Location: PACU  Anesthesia Type:General  Level of Consciousness: awake and drowsy  Airway & Oxygen Therapy: Patient Spontanous Breathing and Patient connected to face mask oxygen  Post-op Assessment: Report given to RN and Post -op Vital signs reviewed and stable  Post vital signs: Reviewed and stable  Last Vitals:  Vitals Value Taken Time  BP 115/67 12/20/19 1809  Temp    Pulse 63 12/20/19 1813  Resp 12 12/20/19 1813  SpO2 100 % 12/20/19 1813  Vitals shown include unvalidated device data.  Last Pain:  Vitals:   12/20/19 1044  TempSrc:   PainSc: 6       Patients Stated Pain Goal: 3 (12/20/19 1044)  Complications: No apparent anesthesia complications

## 2019-12-20 NOTE — Anesthesia Preprocedure Evaluation (Addendum)
Anesthesia Evaluation  Patient identified by MRN, date of birth, ID band Patient awake    Reviewed: Allergy & Precautions, H&P , NPO status , Patient's Chart, lab work & pertinent test results  Airway Mallampati: II  TM Distance: >3 FB Neck ROM: Full    Dental no notable dental hx. (+) Edentulous Upper, Dental Advisory Given   Pulmonary neg pulmonary ROS, former smoker,    Pulmonary exam normal breath sounds clear to auscultation       Cardiovascular negative cardio ROS   Rhythm:Regular Rate:Normal     Neuro/Psych negative neurological ROS  negative psych ROS   GI/Hepatic negative GI ROS, Neg liver ROS,   Endo/Other  Morbid obesity  Renal/GU negative Renal ROS  negative genitourinary   Musculoskeletal  (+) Arthritis , Osteoarthritis,    Abdominal   Peds  Hematology negative hematology ROS (+)   Anesthesia Other Findings   Reproductive/Obstetrics negative OB ROS                            Anesthesia Physical Anesthesia Plan  ASA: II  Anesthesia Plan: General   Post-op Pain Management:    Induction: Intravenous  PONV Risk Score and Plan: 3 and Ondansetron, Dexamethasone and Midazolam  Airway Management Planned: Oral ETT  Additional Equipment:   Intra-op Plan:   Post-operative Plan: Extubation in OR  Informed Consent: I have reviewed the patients History and Physical, chart, labs and discussed the procedure including the risks, benefits and alternatives for the proposed anesthesia with the patient or authorized representative who has indicated his/her understanding and acceptance.     Dental advisory given  Plan Discussed with: CRNA  Anesthesia Plan Comments:         Anesthesia Quick Evaluation

## 2019-12-21 ENCOUNTER — Inpatient Hospital Stay (HOSPITAL_COMMUNITY)

## 2019-12-21 ENCOUNTER — Inpatient Hospital Stay (HOSPITAL_COMMUNITY): Admission: RE | Admit: 2019-12-21 | Source: Home / Self Care | Admitting: Orthopedic Surgery

## 2019-12-21 ENCOUNTER — Inpatient Hospital Stay (HOSPITAL_COMMUNITY): Admitting: Certified Registered"

## 2019-12-21 ENCOUNTER — Encounter (HOSPITAL_COMMUNITY)
Admission: RE | Disposition: A | Discharge status: Home / Self Care | Payer: Self-pay | Source: Home / Self Care | Attending: Orthopedic Surgery

## 2019-12-21 ENCOUNTER — Encounter (HOSPITAL_COMMUNITY): Payer: Self-pay | Admitting: Orthopedic Surgery

## 2019-12-21 HISTORY — PX: POSTERIOR LUMBAR FUSION 4 LEVEL: SHX6037

## 2019-12-21 SURGERY — POSTERIOR LUMBAR FUSION 4 LEVEL
Anesthesia: General

## 2019-12-21 MED ORDER — ONDANSETRON HCL 4 MG/2ML IJ SOLN
INTRAMUSCULAR | Status: AC
  Filled 2019-12-21: qty 2

## 2019-12-21 MED ORDER — LIDOCAINE 2% (20 MG/ML) 5 ML SYRINGE
INTRAMUSCULAR | Status: AC
  Filled 2019-12-21: qty 5

## 2019-12-21 MED ORDER — HYDROMORPHONE HCL 1 MG/ML IJ SOLN
INTRAMUSCULAR | Status: AC
  Filled 2019-12-21: qty 1

## 2019-12-21 MED ORDER — BUPIVACAINE LIPOSOME 1.3 % IJ SUSP
20.0000 mL | Freq: Once | INTRAMUSCULAR | Status: DC
  Filled 2019-12-21: qty 20

## 2019-12-21 MED ORDER — METHYLENE BLUE 0.5 % INJ SOLN
INTRAVENOUS | Status: AC
  Filled 2019-12-21: qty 10

## 2019-12-21 MED ORDER — ROCURONIUM BROMIDE 100 MG/10ML IV SOLN
INTRAVENOUS | Status: DC | PRN
  Administered 2019-12-21: 70 mg via INTRAVENOUS

## 2019-12-21 MED ORDER — ROCURONIUM BROMIDE 10 MG/ML (PF) SYRINGE
PREFILLED_SYRINGE | INTRAVENOUS | Status: AC
  Filled 2019-12-21: qty 10

## 2019-12-21 MED ORDER — CHLORHEXIDINE GLUCONATE 0.12 % MT SOLN
OROMUCOSAL | Status: AC
  Administered 2019-12-21: 15 mL via OROMUCOSAL
  Filled 2019-12-21: qty 15

## 2019-12-21 MED ORDER — HEMOSTATIC AGENTS (NO CHARGE) OPTIME
TOPICAL | Status: DC | PRN
  Administered 2019-12-21: 1 via TOPICAL

## 2019-12-21 MED ORDER — EPINEPHRINE PF 1 MG/ML IJ SOLN
INTRAMUSCULAR | Status: AC
  Filled 2019-12-21: qty 1

## 2019-12-21 MED ORDER — PROPOFOL 10 MG/ML IV BOLUS
INTRAVENOUS | Status: AC
  Filled 2019-12-21: qty 20

## 2019-12-21 MED ORDER — GLYCOPYRROLATE PF 0.2 MG/ML IJ SOSY
PREFILLED_SYRINGE | INTRAMUSCULAR | Status: DC | PRN
  Administered 2019-12-21: 4 mg via INTRAVENOUS

## 2019-12-21 MED ORDER — SUGAMMADEX SODIUM 200 MG/2ML IV SOLN
INTRAVENOUS | Status: DC | PRN
  Administered 2019-12-21: 200 mg via INTRAVENOUS

## 2019-12-21 MED ORDER — FENTANYL CITRATE (PF) 250 MCG/5ML IJ SOLN
INTRAMUSCULAR | Status: DC | PRN
  Administered 2019-12-21 (×10): 50 ug via INTRAVENOUS

## 2019-12-21 MED ORDER — FENTANYL CITRATE (PF) 250 MCG/5ML IJ SOLN
INTRAMUSCULAR | Status: AC
  Filled 2019-12-21: qty 5

## 2019-12-21 MED ORDER — LIDOCAINE 2% (20 MG/ML) 5 ML SYRINGE
INTRAMUSCULAR | Status: DC | PRN
  Administered 2019-12-21: 100 mg via INTRAVENOUS

## 2019-12-21 MED ORDER — PROPOFOL 1000 MG/100ML IV EMUL
INTRAVENOUS | Status: AC
  Filled 2019-12-21: qty 100

## 2019-12-21 MED ORDER — CHLORHEXIDINE GLUCONATE 0.12 % MT SOLN: 15.0000 mL | Freq: Once | OROMUCOSAL | Status: AC

## 2019-12-21 MED ORDER — 0.9 % SODIUM CHLORIDE (POUR BTL) OPTIME
TOPICAL | Status: DC | PRN
  Administered 2019-12-21: 1000 mL

## 2019-12-21 MED ORDER — LACTATED RINGERS IV SOLN: INTRAVENOUS | Status: DC | PRN

## 2019-12-21 MED ORDER — MIDAZOLAM HCL 2 MG/2ML IJ SOLN
INTRAMUSCULAR | Status: AC
  Filled 2019-12-21: qty 2

## 2019-12-21 MED ORDER — BUPIVACAINE HCL (PF) 0.25 % IJ SOLN
INTRAMUSCULAR | Status: AC
  Filled 2019-12-21: qty 30

## 2019-12-21 MED ORDER — CEFAZOLIN SODIUM-DEXTROSE 2-3 GM-%(50ML) IV SOLR
INTRAVENOUS | Status: DC | PRN
Start: 2019-12-21 — End: 2019-12-21
  Administered 2019-12-21: 2 g via INTRAVENOUS

## 2019-12-21 MED ORDER — DEXAMETHASONE SODIUM PHOSPHATE 10 MG/ML IJ SOLN
INTRAMUSCULAR | Status: DC | PRN
  Administered 2019-12-21: 10 mg via INTRAVENOUS

## 2019-12-21 MED ORDER — ORAL CARE MOUTH RINSE: 15.0000 mL | Freq: Once | OROMUCOSAL | Status: AC

## 2019-12-21 MED ORDER — THROMBIN 20000 UNITS EX SOLR
CUTANEOUS | Status: AC
  Filled 2019-12-21: qty 20000

## 2019-12-21 MED ORDER — MIDAZOLAM HCL 5 MG/5ML IJ SOLN
INTRAMUSCULAR | Status: DC | PRN
  Administered 2019-12-21 (×2): 1 mg via INTRAVENOUS

## 2019-12-21 MED ORDER — PROPOFOL 1000 MG/100ML IV EMUL
INTRAVENOUS | Status: AC
  Filled 2019-12-21: qty 200

## 2019-12-21 MED ORDER — HYDROMORPHONE HCL 1 MG/ML IJ SOLN
0.2500 mg | INTRAMUSCULAR | Status: DC | PRN
  Administered 2019-12-21: 0.5 mg via INTRAVENOUS

## 2019-12-21 MED ORDER — DEXAMETHASONE SODIUM PHOSPHATE 10 MG/ML IJ SOLN
INTRAMUSCULAR | Status: AC
  Filled 2019-12-21: qty 1

## 2019-12-21 MED ORDER — PHENYLEPHRINE 40 MCG/ML (10ML) SYRINGE FOR IV PUSH (FOR BLOOD PRESSURE SUPPORT)
PREFILLED_SYRINGE | INTRAVENOUS | Status: DC | PRN
  Administered 2019-12-21 (×2): 80 ug via INTRAVENOUS
  Administered 2019-12-21: 40 ug via INTRAVENOUS

## 2019-12-21 MED ORDER — PHENYLEPHRINE HCL-NACL 10-0.9 MG/250ML-% IV SOLN
INTRAVENOUS | Status: DC | PRN
  Administered 2019-12-21: 35 ug/min via INTRAVENOUS

## 2019-12-21 MED ORDER — DIPHENHYDRAMINE HCL 25 MG PO CAPS: 25.0000 mg | ORAL_CAPSULE | Freq: Four times a day (QID) | ORAL | Status: DC | PRN

## 2019-12-21 MED ORDER — CEFAZOLIN SODIUM 1 G IJ SOLR
INTRAMUSCULAR | Status: AC
  Filled 2019-12-21: qty 20

## 2019-12-21 MED ORDER — CEFAZOLIN SODIUM-DEXTROSE 2-4 GM/100ML-% IV SOLN: 2.0000 g | INTRAVENOUS | Status: DC

## 2019-12-21 MED ORDER — THROMBIN 20000 UNITS EX KIT
PACK | CUTANEOUS | Status: DC | PRN
  Administered 2019-12-21: 20 mL via TOPICAL

## 2019-12-21 MED ORDER — PROPOFOL 10 MG/ML IV BOLUS
INTRAVENOUS | Status: DC | PRN
  Administered 2019-12-21: 50 mg via INTRAVENOUS
  Administered 2019-12-21: 200 mg via INTRAVENOUS

## 2019-12-21 MED ORDER — LACTATED RINGERS IV SOLN
INTRAVENOUS | Status: DC | PRN
Start: 2019-12-21 — End: 2019-12-21

## 2019-12-21 MED ORDER — PROPOFOL 500 MG/50ML IV EMUL
INTRAVENOUS | Status: DC | PRN
Start: 2019-12-21 — End: 2019-12-21
  Administered 2019-12-21: 100 ug/kg/min via INTRAVENOUS

## 2019-12-21 MED FILL — Thrombin (Recombinant) For Soln 20000 Unit: CUTANEOUS | Qty: 1 | Status: AC

## 2019-12-21 SURGICAL SUPPLY — 89 items
BENZOIN TINCTURE PRP APPL 2/3 (GAUZE/BANDAGES/DRESSINGS) ×4 IMPLANT
BLADE CLIPPER SURG (BLADE) IMPLANT
BONE VIVIGEN FORMABLE 1.3CC (Bone Implant) ×2 IMPLANT
BUR PRESCISION 1.7 ELITE (BURR) ×2 IMPLANT
BUR ROUND FLUTED 5 RND (BURR) ×2 IMPLANT
BUR ROUND PRECISION 4.0 (BURR) IMPLANT
BUR SABER RD CUTTING 3.0 (BURR) IMPLANT
CARTRIDGE OIL MAESTRO DRILL (MISCELLANEOUS) ×2 IMPLANT
CLSR STERI-STRIP ANTIMIC 1/2X4 (GAUZE/BANDAGES/DRESSINGS) ×4 IMPLANT
CNTNR URN SCR LID CUP LEK RST (MISCELLANEOUS) ×1 IMPLANT
CONT SPEC 4OZ STRL OR WHT (MISCELLANEOUS) ×1
CORD BIPOLAR FORCEPS 12FT (ELECTRODE) ×2 IMPLANT
COVER SURGICAL LIGHT HANDLE (MISCELLANEOUS) ×2 IMPLANT
COVER WAND RF STERILE (DRAPES) ×2 IMPLANT
DIFFUSER DRILL AIR PNEUMATIC (MISCELLANEOUS) ×4 IMPLANT
DRAIN CHANNEL 15F RND FF W/TCR (WOUND CARE) ×2 IMPLANT
DRAPE C-ARM 42X72 X-RAY (DRAPES) ×2 IMPLANT
DRAPE ORTHO SPLIT 77X108 STRL (DRAPES) ×1
DRAPE POUCH INSTRU U-SHP 10X18 (DRAPES) ×2 IMPLANT
DRAPE SURG 17X23 STRL (DRAPES) ×8 IMPLANT
DRAPE SURG ORHT 6 SPLT 77X108 (DRAPES) ×1 IMPLANT
DRSG MEPILEX BORDER 4X12 (GAUZE/BANDAGES/DRESSINGS) IMPLANT
DRSG MEPILEX BORDER 4X8 (GAUZE/BANDAGES/DRESSINGS) IMPLANT
DURAPREP 26ML APPLICATOR (WOUND CARE) ×2 IMPLANT
ELECT BLADE 4.0 EZ CLEAN MEGAD (MISCELLANEOUS) ×2
ELECT CAUTERY BLADE 6.4 (BLADE) ×4 IMPLANT
ELECT REM PT RETURN 9FT ADLT (ELECTROSURGICAL) ×2
ELECTRODE BLDE 4.0 EZ CLN MEGD (MISCELLANEOUS) ×1 IMPLANT
ELECTRODE REM PT RTRN 9FT ADLT (ELECTROSURGICAL) ×1 IMPLANT
EVACUATOR SILICONE 100CC (DRAIN) ×2 IMPLANT
FEE INTRAOP MONITOR IMPULS NCS (MISCELLANEOUS) ×1 IMPLANT
GAUZE 4X4 16PLY RFD (DISPOSABLE) ×8 IMPLANT
GAUZE SPONGE 4X4 12PLY STRL (GAUZE/BANDAGES/DRESSINGS) ×2 IMPLANT
GLOVE BIO SURGEON STRL SZ7 (GLOVE) ×2 IMPLANT
GLOVE BIO SURGEON STRL SZ8 (GLOVE) ×2 IMPLANT
GLOVE BIOGEL PI IND STRL 8 (GLOVE) ×1 IMPLANT
GLOVE BIOGEL PI INDICATOR 8 (GLOVE) ×1
GOWN STRL REUS W/ TWL LRG LVL3 (GOWN DISPOSABLE) ×2 IMPLANT
GOWN STRL REUS W/ TWL XL LVL3 (GOWN DISPOSABLE) ×1 IMPLANT
GOWN STRL REUS W/TWL LRG LVL3 (GOWN DISPOSABLE) ×2
GOWN STRL REUS W/TWL XL LVL3 (GOWN DISPOSABLE) ×1
GUIDEWIRE SHARP VIPER II (WIRE) ×8 IMPLANT
INTRAOP MONITOR FEE IMPULS NCS (MISCELLANEOUS) ×1
INTRAOP MONITOR FEE IMPULSE (MISCELLANEOUS) ×1
IV CATH 14GX2 1/4 (CATHETERS) ×2 IMPLANT
KIT BASIN OR (CUSTOM PROCEDURE TRAY) ×2 IMPLANT
KIT POSITION SURG JACKSON T1 (MISCELLANEOUS) ×2 IMPLANT
KIT TURNOVER KIT B (KITS) ×2 IMPLANT
MARKER SKIN DUAL TIP RULER LAB (MISCELLANEOUS) ×2 IMPLANT
MATRIX HEMOSTAT SURGIFLO (HEMOSTASIS) ×2 IMPLANT
NEEDLE HYPO 25GX1X1/2 BEV (NEEDLE) ×2 IMPLANT
NEEDLE SPNL 18GX3.5 QUINCKE PK (NEEDLE) ×4 IMPLANT
NS IRRIG 1000ML POUR BTL (IV SOLUTION) ×2 IMPLANT
OIL CARTRIDGE MAESTRO DRILL (MISCELLANEOUS) ×4
PACK LAMINECTOMY ORTHO (CUSTOM PROCEDURE TRAY) ×2 IMPLANT
PACK UNIVERSAL I (CUSTOM PROCEDURE TRAY) ×2 IMPLANT
PAD ARMBOARD 7.5X6 YLW CONV (MISCELLANEOUS) ×4 IMPLANT
PATTIES SURGICAL .5 X1 (DISPOSABLE) ×2 IMPLANT
PATTIES SURGICAL .5 X3 (DISPOSABLE) ×2 IMPLANT
PATTIES SURGICAL .5X1.5 (GAUZE/BANDAGES/DRESSINGS) ×2 IMPLANT
PATTIES SURGICAL .75X.75 (GAUZE/BANDAGES/DRESSINGS) ×2 IMPLANT
PROBE PED SCREW MONOP 3 BT NCS (MISCELLANEOUS) ×1 IMPLANT
ROD VIPER PREBENT 100MM (Rod) ×4 IMPLANT
SCREW PROBE PEDICLE MONOPOLAR (MISCELLANEOUS) ×1
SCREW SET SINGLE INNER MIS (Screw) ×16 IMPLANT
SCREW XTAB POLY VIPER  7X45 (Screw) ×4 IMPLANT
SCREW XTAB POLY VIPER 7X45 (Screw) ×4 IMPLANT
SPONGE INTESTINAL PEANUT (DISPOSABLE) ×2 IMPLANT
SPONGE SURGIFOAM ABS GEL 100 (HEMOSTASIS) IMPLANT
STRIP CLOSURE SKIN 1/2X4 (GAUZE/BANDAGES/DRESSINGS) IMPLANT
SURGIFLO W/THROMBIN 8M KIT (HEMOSTASIS) IMPLANT
SUT MNCRL AB 4-0 PS2 18 (SUTURE) ×4 IMPLANT
SUT PROLENE 6 0 C 1 24 (SUTURE) IMPLANT
SUT VIC AB 0 CT1 18XCR BRD 8 (SUTURE) ×2 IMPLANT
SUT VIC AB 0 CT1 8-18 (SUTURE) ×2
SUT VIC AB 1 CT1 18XCR BRD 8 (SUTURE) ×2 IMPLANT
SUT VIC AB 1 CT1 8-18 (SUTURE) ×2
SUT VIC AB 2-0 CT2 18 VCP726D (SUTURE) ×4 IMPLANT
SYR 20ML LL LF (SYRINGE) ×2 IMPLANT
SYR BULB IRRIG 60ML STRL (SYRINGE) ×2 IMPLANT
SYR CONTROL 10ML LL (SYRINGE) ×2 IMPLANT
SYR TB 1ML LUER SLIP (SYRINGE) ×2 IMPLANT
TAP CANN VIPER2 DL 6.0 (TAP) ×4 IMPLANT
TAPE CLOTH SURG 6X10 WHT LF (GAUZE/BANDAGES/DRESSINGS) ×2 IMPLANT
TOWEL GREEN STERILE (TOWEL DISPOSABLE) ×2 IMPLANT
TOWEL GREEN STERILE FF (TOWEL DISPOSABLE) ×2 IMPLANT
TRAY FOLEY MTR SLVR 16FR STAT (SET/KITS/TRAYS/PACK) ×2 IMPLANT
WATER STERILE IRR 1000ML POUR (IV SOLUTION) ×2 IMPLANT
YANKAUER SUCT BULB TIP NO VENT (SUCTIONS) ×2 IMPLANT

## 2019-12-21 NOTE — Transfer of Care (Signed)
Immediate Anesthesia Transfer of Care Note  Patient: Calvin Marsh  Procedure(s) Performed: POSTERIOR SPINAL FUSION LUMBAR 3 -SACRUM 1 WITH INSTRUMENTATION AND ALLOGRAFT (N/A )  Patient Location: PACU  Anesthesia Type:General  Level of Consciousness: awake, alert , oriented and patient cooperative  Airway & Oxygen Therapy: Patient Spontanous Breathing and Patient connected to nasal cannula oxygen  Post-op Assessment: Report given to RN and Post -op Vital signs reviewed and stable  Post vital signs: Reviewed and stable  Last Vitals:  Vitals Value Taken Time  BP 116/64 12/21/19 1613  Temp    Pulse 75 12/21/19 1615  Resp 13 12/21/19 1615  SpO2 94 % 12/21/19 1615  Vitals shown include unvalidated device data.  Last Pain:  Vitals:   12/21/19 0830  TempSrc:   PainSc: 3       Patients Stated Pain Goal: 3 (12/21/19 0830)  Complications: No apparent anesthesia complications

## 2019-12-21 NOTE — Anesthesia Preprocedure Evaluation (Addendum)
Anesthesia Evaluation  Patient identified by MRN, date of birth, ID band Patient awake    Reviewed: Allergy & Precautions, H&P , NPO status , Patient's Chart, lab work & pertinent test results  Airway Mallampati: II  TM Distance: >3 FB Neck ROM: Full    Dental no notable dental hx. (+) Edentulous Upper, Dental Advisory Given   Pulmonary neg pulmonary ROS, former smoker,    Pulmonary exam normal breath sounds clear to auscultation       Cardiovascular negative cardio ROS   Rhythm:Regular Rate:Normal     Neuro/Psych negative neurological ROS  negative psych ROS   GI/Hepatic negative GI ROS, Neg liver ROS,   Endo/Other  negative endocrine ROS  Renal/GU negative Renal ROS  negative genitourinary   Musculoskeletal  (+) Arthritis ,   Abdominal   Peds  Hematology negative hematology ROS (+)   Anesthesia Other Findings   Reproductive/Obstetrics negative OB ROS                            Anesthesia Physical Anesthesia Plan  ASA: II  Anesthesia Plan: General   Post-op Pain Management:    Induction: Intravenous  PONV Risk Score and Plan: 3 and Ondansetron, Dexamethasone and Midazolam  Airway Management Planned: Oral ETT  Additional Equipment:   Intra-op Plan:   Post-operative Plan: Extubation in OR  Informed Consent: I have reviewed the patients History and Physical, chart, labs and discussed the procedure including the risks, benefits and alternatives for the proposed anesthesia with the patient or authorized representative who has indicated his/her understanding and acceptance.     Dental advisory given  Plan Discussed with: CRNA  Anesthesia Plan Comments:         Anesthesia Quick Evaluation

## 2019-12-21 NOTE — Anesthesia Procedure Notes (Signed)
Procedure Name: Intubation Date/Time: 12/21/2019 1:18 PM Performed by: Janene Harvey, CRNA Pre-anesthesia Checklist: Patient identified, Emergency Drugs available, Suction available and Patient being monitored Patient Re-evaluated:Patient Re-evaluated prior to induction Oxygen Delivery Method: Circle system utilized Preoxygenation: Pre-oxygenation with 100% oxygen Induction Type: IV induction Ventilation: Mask ventilation without difficulty and Oral airway inserted - appropriate to patient size Laryngoscope Size: Mac and 4 Grade View: Grade I Tube type: Oral Tube size: 7.5 mm Number of attempts: 1 Airway Equipment and Method: Stylet and Oral airway Placement Confirmation: ETT inserted through vocal cords under direct vision,  positive ETCO2 and breath sounds checked- equal and bilateral Secured at: 22 cm Tube secured with: Tape Dental Injury: Teeth and Oropharynx as per pre-operative assessment

## 2019-12-21 NOTE — Op Note (Signed)
PATIENT NAME: Calvin Marsh   MEDICAL RECORD NO.:   [id number]    DATE OF BIRTH: Jan 29, 1961   DATE OF PROCEDURE: 12/21/2019                               OPERATIVE REPORT     PREOPERATIVE DIAGNOSES: 1.  Status post L3-4 and L4-5 lateral lumbar fusion on 12/21/2019, requiring posterior fusion with instrumentation   POSTOPERATIVE DIAGNOSES:   1.  Status post L3-4 and L4-5 lateral lumbar fusion on 12/21/2019, requiring posterior fusion with instrumentation     PROCEDURE (Stage 2 of 2): 1. Posterior spinal fusion,  L3-4, L4-5 2. Placement of posterior segmental instrumentation L3, and L4 bilaterally.  This was connected to the previously placed 5 and S1 pedicle screws from a previous procedure 3. Use of morselized allograft - Vivigen. 4. Intraoperative use of floroscopy   SURGEON:  Phylliss Bob, MD   ASSISTANT:  Pricilla Holm, PA-C   ANESTHESIA:  General endotracheal anesthesia.   COMPLICATIONS:  None.   DISPOSITION:  Stable.   ESTIMATED BLOOD LOSS:  Minimal   INDICATIONS FOR SURGERY: Briefly, Calvin Marsh is one day status post a lateral lumbar fusion as noted above.  Please refer to my operative report dated 12/20/2019, for a full account of the patient's preoperative history and indications for surgery.  The patient did present today for stage 2 of what was to be a 2-staged procedure.   OPERATIVE DETAILS:  On 12/21/2019, the patient was brought to surgery and general endotracheal anesthesia was administered.  The patient was placed prone onto a Jackson spinal bed.  The back was then prepped and draped in the usual sterile fashion.  I then made paramedian incisions on the right and left sides, just lateral to the lateral borders of the pedicles spanning L3-S1.  On the left side, the posterolateral gutter and posterior elements at the L3-4 and L4-5 levels were identified and exposed and decorticated.  Vivigen was packed into the posterolateral gutter on the left side to aid in  the success of the posterior fusion.    At this point, Calvin Marsh and guidewires were placed into the L3 and L4 pedicles bilaterally.  7 x 45 mm screws were placed bilaterally at both L3 and L4.  The guidewires were then removed.  I did use triggered EMG to test the taps at L3 and L4, and there was no tap that tested below 20 mA.  The previously noted L5-S1 construct was identified bilaterally.  The caps and interconnecting rods were removed.  At this point, 100 mm rods were placed bilaterally and secured into the tulip heads at L3, L4, L5, and S1 bilaterally.  Caps were placed and a final locking procedure was performed.  I was very pleased with the final fusion construct on AP and lateral fluoroscopic imaging. The wound was then copiously irrigated.  On the right and left sides, the fascia was closed using #1 Vicryl.  The subcutaneous layer was closed using 0 Vicryl followed by 2-0 Vicryl, and the skin was then closed using 4-0 Monocryl. Benzoin and Steri-Strips were applied followed by sterile dressing.  All instrument counts were correct at the termination of the procedure. Again, I did use neurologic monitoring throughout the surgery, and there was no abnormal EMG activity noted throughout the surgery.   Of note, Pricilla Holm was my assistant throughout surgery, and did aid in retraction, suctioning, placement of the hardware, and  closure.      [name], MD

## 2019-12-21 NOTE — H&P (Signed)
Patient tolerated stage 1 of surgery well yesterday. Will proceed with stage 2 as planned.

## 2019-12-22 ENCOUNTER — Encounter: Payer: Self-pay | Admitting: *Deleted

## 2019-12-22 MED ORDER — OXYCODONE-ACETAMINOPHEN 5-325 MG PO TABS: 1.0000 | ORAL_TABLET | ORAL | 0 refills | Status: AC | PRN

## 2019-12-22 MED ORDER — METHOCARBAMOL 500 MG PO TABS: 500.0000 mg | ORAL_TABLET | Freq: Four times a day (QID) | ORAL | 2 refills | Status: AC | PRN

## 2019-12-22 NOTE — Anesthesia Postprocedure Evaluation (Signed)
Anesthesia Post Note  Patient: Calvin Marsh  Procedure(s) Performed: LEFT LATERAL LUMBAR 3- LUMBAR 4, LUMBAR 4 - LUMBAR 5 INTERBODY FUSION WITH INSTRUMENTATION AND ALLOGRAFT (Left )     Patient location during evaluation: PACU Anesthesia Type: General Level of consciousness: sedated, oriented and patient cooperative Pain management: pain level controlled Vital Signs Assessment: post-procedure vital signs reviewed and stable Respiratory status: spontaneous breathing, nonlabored ventilation, respiratory function stable and patient connected to nasal cannula oxygen Cardiovascular status: blood pressure returned to baseline and stable Postop Assessment: no apparent nausea or vomiting Anesthetic complications: no    Last Vitals:  Vitals:   12/22/19 0437 12/22/19 0755  BP: 108/61 139/82  Pulse: (!) 59 73  Resp:  16  Temp: 36.8 C 36.8 C  SpO2: 96% 98%    Last Pain:  Vitals:   12/22/19 1032  TempSrc:   PainSc: 5                  Nelta Caudill,E. Harvel Meskill

## 2019-12-22 NOTE — Progress Notes (Signed)
Patient is discharged from room 3C10 at this time. Alert and in stable condition. IV site d/c'd and instructions read to patient and daughter with understanding verbalized. Left unit via wheelchair with all belongings at side.  

## 2019-12-22 NOTE — Evaluation (Signed)
Physical Therapy Evaluation & Discharge Patient Details Name: Calvin Marsh MRN: [id number] DOB: November 06, 1960 Today's Date: 12/22/2019   History of Present Illness  Pt is a 59 y.o. male s/p L3/4 and L4/5 lateral/posterior fusion. PMH includes ALIF 06/2019, right TKA.  Clinical Impression  Pt presented supine in bed with HOB elevated, awake and willing to participate in therapy session. Prior to admission, pt reported that he used a RW to ambulate PRN and required some assistance with ADLs from wife. Pt lives with his spouse and daughter in a two level home with a level entry. At the time of evaluation, pt moving well overall. He was able to perform bed mobility with supervision, transfers with min A and ambulated in hallway without an AD with supervision for safety. No LOB or instability noted. PT provided pt education re: back precautions with handout provided and a generalized walking program for pt to initiate upon d/c home. Plan is to d/c home today with family support. No further acute PT needs identified at this time. PT signing off.     Follow Up Recommendations No PT follow up    Equipment Recommendations  None recommended by PT    Recommendations for Other Services       Precautions / Restrictions Precautions Precautions: Fall;Back Precaution Booklet Issued: Yes (comment) Precaution Comments: provided handout and reviewed with pt Required Braces or Orthoses: Spinal Brace Spinal Brace: Thoracolumbosacral orthotic;Applied in sitting position;Applied in standing position Restrictions Weight Bearing Restrictions: No      Mobility  Bed Mobility Overal bed mobility: Needs Assistance Bed Mobility: Rolling;Sidelying to Sit Rolling: Supervision Sidelying to sit: Supervision       General bed mobility comments: supervision for proper technique and vc for initiation of log rolling technique, no physical assistance necessary;pt using bed rails   Transfers Overall transfer  level: Needs assistance Equipment used: 1 person hand held assist Transfers: Sit to/from Stand Sit to Stand: Min assist         General transfer comment: minA from therapist with 1 hand held assistance to power up into standing from lower surface  Ambulation/Gait Ambulation/Gait assistance: Supervision Gait Distance (Feet): 500 Feet Assistive device: None Gait Pattern/deviations: Step-through pattern Gait velocity: decreased   General Gait Details: pt with slow, steady gait in hallway without use of an AD; no LOB or need for physical assistance  Stairs            Wheelchair Mobility    Modified Rankin (Stroke Patients Only)       Balance Overall balance assessment: Mild deficits observed, not formally tested                                           Pertinent Vitals/Pain Pain Assessment: Faces Pain Score: 5  Faces Pain Scale: Hurts even more Pain Location: back incision site Pain Descriptors / Indicators: Sore;Discomfort Pain Intervention(s): Monitored during session;Repositioned    Home Living Family/patient expects to be discharged to:: Private residence Living Arrangements: Spouse/significant other Available Help at Discharge: Family Type of Home: House Home Access: Level entry Entrance Stairs-Rails: None Entrance Stairs-Number of Steps: 4 Home Layout: Able to live on main level with bedroom/bathroom Home Equipment: Walker - 2 wheels;Bedside commode;Grab bars - toilet;Grab bars - tub/shower      Prior Function Level of Independence: Needs assistance   Gait / Transfers Assistance Needed: pt reports he has a  walker would use it prn  ADL's / Homemaking Assistance Needed: pt reports wife sould assist with LB dressing prn        Hand Dominance   Dominant Hand: Right    Extremity/Trunk Assessment   Upper Extremity Assessment Upper Extremity Assessment: Defer to OT evaluation;Overall WFL for tasks assessed    Lower Extremity  Assessment Lower Extremity Assessment: Overall WFL for tasks assessed    Cervical / Trunk Assessment Cervical / Trunk Assessment: Other exceptions Cervical / Trunk Exceptions: s/p lumbar sx  Communication   Communication: No difficulties  Cognition Arousal/Alertness: Awake/alert Behavior During Therapy: WFL for tasks assessed/performed Overall Cognitive Status: Within Functional Limits for tasks assessed                                 General Comments: pt verbalized 3/3 back precautions, able to problem solve through hypothetical scenarios for adherence to precautions      General Comments General comments (skin integrity, edema, etc.): educated pt on environmental modifications to set items up at countertop height    Exercises     Assessment/Plan    PT Assessment Patent does not need any further PT services  PT Problem List         PT Treatment Interventions      PT Goals (Current goals can be found in the Care Plan section)  Acute Rehab PT Goals Patient Stated Goal: to go home today PT Goal Formulation: All assessment and education complete, DC therapy    Frequency     Barriers to discharge        Co-evaluation               AM-PAC PT "6 Clicks" Mobility  Outcome Measure Help needed turning from your back to your side while in a flat bed without using bedrails?: None Help needed moving from lying on your back to sitting on the side of a flat bed without using bedrails?: None Help needed moving to and from a bed to a chair (including a wheelchair)?: None Help needed standing up from a chair using your arms (e.g., wheelchair or bedside chair)?: A Little Help needed to walk in hospital room?: None Help needed climbing 3-5 steps with a railing? : None 6 Click Score: 23    End of Session Equipment Utilized During Treatment: Back brace Activity Tolerance: Patient tolerated treatment well Patient left: in bed;with call bell/phone within  reach;Other (comment)(sitting EOB) Nurse Communication: Mobility status PT Visit Diagnosis: Other abnormalities of gait and mobility (R26.89)    Time: 6629-4765 PT Time Calculation (min) (ACUTE ONLY): 21 min   Charges:   PT Evaluation $PT Eval Low Complexity: 1 Low          Eduard Clos, PT, DPT  Acute Rehabilitation Services Pager [phone] Office [place] 12/22/2019, 10:10 AM

## 2019-12-22 NOTE — Progress Notes (Signed)
° ° °  Patient doing well Has been ambulating + expected low back discomfort Patient reports resolution of leg pain and states that he feels great   Physical Exam: Vitals:   12/21/19 2329 12/22/19 0437  BP: 120/72 108/61  Pulse: 74 (!) 59  Resp: 20   Temp: 98.9 F (37.2 C) 98.2 F (36.8 C)  SpO2: 97% 96%   Patient standing - looks very comfortable Dressing in place NVI  POD s/p L3/4 and L4/5 lateral/posterior fusion, doing exceptionally well, with resolved preop leg pain  - encourage ambulation - Percocet for pain, Robaxin for muscle spasms - d/c home today with f/u in 2 weeks

## 2019-12-22 NOTE — Evaluation (Signed)
Occupational Therapy Evaluation Patient Details Name: Calvin Marsh MRN: [id number] DOB: February 21, 1961 Today's Date: 12/22/2019    History of Present Illness Pt is a 59 y.o. male s/p L3/4 and L4/5 lateral/posterior fusion. PMH includes ALIF 06/2019, right TKA.   Clinical Impression   Pt in bed upon OT arrival. He required supervision and cues for proper bed mobility, no physical assistance provided. Pt required minA for donning back brace and LB dressing. He required 1 person hand held assistance to powerup into standing from a lower surface. Pt verbalized 3/3 back precautions and verbalized adherence to precautions with hypothetical scenarios upon returning home. Pt reports his wife was assisting him prn prior to arrival, and will be able to continue assisting him as needed. Feel pt will continue to progress with assistance from his wife at home. Patient evaluated by Occupational Therapy with no further acute OT needs identified. All education has been completed and the patient has no further questions. See below for any follow-up Occupational Therapy or equipment needs. OT to sign off. Thank you for referral.      Follow Up Recommendations  No OT follow up;Supervision - Intermittent    Equipment Recommendations  None recommended by OT    Recommendations for Other Services       Precautions / Restrictions Precautions Precautions: Fall;Back Precaution Booklet Issued: Yes (comment) Precaution Comments: provided handout and reviewed with pt Required Braces or Orthoses: Spinal Brace Spinal Brace: Thoracolumbosacral orthotic;Applied in sitting position;Applied in standing position Restrictions Weight Bearing Restrictions: No      Mobility Bed Mobility Overal bed mobility: Needs Assistance Bed Mobility: Rolling;Sidelying to Sit Rolling: Supervision Sidelying to sit: Supervision       General bed mobility comments: supervision for proper technique and vc for initiation of log  rolling technique, no physical assistance necessary;pt using bed rails   Transfers Overall transfer level: Needs assistance Equipment used: 1 person hand held assist Transfers: Sit to/from Stand Sit to Stand: Min assist         General transfer comment: minA from therapist with 1 hand held assistance to powerup into standing from lower surface    Balance Overall balance assessment: Mild deficits observed, not formally tested                                         ADL either performed or assessed with clinical judgement   ADL Overall ADL's : Needs assistance/impaired                                     Functional mobility during ADLs: Supervision/safety General ADL Comments: Pt requires supervision for functional mobility without use of AD;he donned back brace with minA to bring strap around back;pt requires minA for LB dressing, he reports he is okay with wife assisting;educated pt on use of AE if needed;pt demonstrated understanding of adherence to precautions during grooming at sink level with supervision for safety;minA to powerup from lower surface;     Vision         Perception     Praxis      Pertinent Vitals/Pain Pain Assessment: 0-10 Pain Score: 5  Pain Location: back incision site Pain Descriptors / Indicators: Sore;Discomfort Pain Intervention(s): Limited activity within patient's tolerance;Monitored during session     Hand Dominance Right   Extremity/Trunk Assessment  Upper Extremity Assessment Upper Extremity Assessment: Overall WFL for tasks assessed   Lower Extremity Assessment Lower Extremity Assessment: Defer to PT evaluation   Cervical / Trunk Assessment Cervical / Trunk Assessment: Other exceptions Cervical / Trunk Exceptions: back precautions   Communication Communication Communication: Prefers language other than English;Other (comment)(pt able to speak and understand english )   Cognition  Arousal/Alertness: Awake/alert Behavior During Therapy: WFL for tasks assessed/performed Overall Cognitive Status: Within Functional Limits for tasks assessed                                 General Comments: pt verbalized 3/3 back precautions, able to problem solve through hypothetical scenarios for adherence to precautions   General Comments  educated pt on environmental modifications to set items up at countertop height    Exercises     Shoulder Instructions      Home Living Family/patient expects to be discharged to:: Private residence Living Arrangements: Spouse/significant other Available Help at Discharge: Family Type of Home: House Home Access: Stairs to enter Secretary/administrator of Steps: 4 Entrance Stairs-Rails: None Home Layout: One level     Bathroom Shower/Tub: Chief Strategy Officer: Standard     Home Equipment: Environmental consultant - 2 wheels;Bedside commode;Grab bars - toilet;Grab bars - tub/shower          Prior Functioning/Environment Level of Independence: Needs assistance  Gait / Transfers Assistance Needed: pt reports he has a walker would use it prn ADL's / Homemaking Assistance Needed: pt reports wife sould assist with LB dressing prn            OT Problem List: Decreased range of motion;Decreased knowledge of precautions;Decreased knowledge of use of DME or AE;Pain      OT Treatment/Interventions:      OT Goals(Current goals can be found in the care plan section) Acute Rehab OT Goals Patient Stated Goal: to go home today OT Goal Formulation: With patient Time For Goal Achievement: 12/29/19 Potential to Achieve Goals: Good  OT Frequency:     Barriers to D/C:            Co-evaluation              AM-PAC OT "6 Clicks" Daily Activity     Outcome Measure Help from another person eating meals?: A Little Help from another person taking care of personal grooming?: A Little Help from another person toileting, which  includes using toliet, bedpan, or urinal?: A Little Help from another person bathing (including washing, rinsing, drying)?: A Little Help from another person to put on and taking off regular upper body clothing?: A Little Help from another person to put on and taking off regular lower body clothing?: A Little 6 Click Score: 18   End of Session Equipment Utilized During Treatment: Back brace Nurse Communication: Mobility status  Activity Tolerance: Patient tolerated treatment well Patient left: in chair;with call bell/phone within reach  OT Visit Diagnosis: Other abnormalities of gait and mobility (R26.89);Pain Pain - part of body: (back incision site)                Time: 8119-1478 OT Time Calculation (min): 14 min Charges:  OT General Charges $OT Visit: 1 Visit OT Evaluation $OT Eval Low Complexity: 1 Low  Jacquilyn Seldon OTR/L Acute Rehabilitation Services Office: [phone]   Rebeca Alert 12/22/2019, 9:16 AM

## 2019-12-24 NOTE — Anesthesia Postprocedure Evaluation (Signed)
Anesthesia Post Note  Patient: Calvin Marsh  Procedure(s) Performed: POSTERIOR SPINAL FUSION LUMBAR 3 -SACRUM 1 WITH INSTRUMENTATION AND ALLOGRAFT (N/A )     Patient location during evaluation: PACU Anesthesia Type: General Level of consciousness: awake Pain management: pain level controlled Vital Signs Assessment: post-procedure vital signs reviewed and stable Respiratory status: spontaneous breathing Cardiovascular status: stable Postop Assessment: no apparent nausea or vomiting Anesthetic complications: no   No complications documented.  Last Vitals:  Vitals:   12/22/19 0437 12/22/19 0755  BP: 108/61 139/82  Pulse: (!) 59 73  Resp:  16  Temp: 36.8 C 36.8 C  SpO2: 96% 98%    Last Pain:  Vitals:   12/22/19 1032  TempSrc:   PainSc: 5                  John F [address]

## 2019-12-30 NOTE — Discharge Summary (Signed)
Patient ID: Calvin Marsh MRN: [id number] DOB/AGE: 1961/07/01 59 y.o.  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 male who was admitted 12/20/2019 for operative treatment of radiculopathy. Patient has severe unremitting pain that affects sleep, daily activities, and work/hobbies. After pre-op clearance the patient was taken to the operating room on 12/21/2019 and underwent  Procedure(s): POSTERIOR SPINAL FUSION LUMBAR 3 -SACRUM 1 WITH INSTRUMENTATION AND ALLOGRAFT.    Patient was given perioperative antibiotics:  Anti-infectives (From admission, onward)   Start     Dose/Rate Route Frequency Ordered Stop   12/21/19 1230  ceFAZolin (ANCEF) IVPB 2g/100 mL premix  Status:  Discontinued        2 g 200 mL/hr over 30 Minutes Intravenous On call to O.R. 12/21/19 0525 12/21/19 0531   12/21/19 0600  ceFAZolin (ANCEF) IVPB 2g/100 mL premix  Status:  Discontinued        2 g 200 mL/hr over 30 Minutes Intravenous On call to O.R. 12/20/19 2106 12/21/19 0525   12/20/19 2300  ceFAZolin (ANCEF) IVPB 2g/100 mL premix        2 g 200 mL/hr over 30 Minutes Intravenous Every 8 hours 12/20/19 2106 12/21/19 0622   12/20/19 1030  ceFAZolin (ANCEF) IVPB 2g/100 mL premix        2 g 200 mL/hr over 30 Minutes Intravenous On call to O.R. 12/20/19 1027 12/20/19 1518       Patient was given sequential compression devices, early ambulation to  prevent DVT.  Patient benefited maximally from hospital stay and there were no complications.    Recent vital signs: BP 139/82 (BP Location: Right Arm)    Pulse 73    Temp 98.3 F (36.8 C) (Oral)    Resp 16    Ht 5\' 6"  (1.676 m)    Wt 98.9 kg    SpO2 98%    BMI 35.19 kg/m    Discharge Medications:   Allergies as of 12/22/2019   No Known Allergies     Medication List    TAKE these medications   methocarbamol 500 MG tablet Commonly known as: ROBAXIN Take 1 tablet (500 mg total) by mouth every 6 (six) hours as needed for muscle spasms.   oxyCODONE-acetaminophen 5-325 MG tablet Commonly known as: PERCOCET/ROXICET Take 1-2 tablets by mouth every 4 (four) hours as needed for moderate pain or severe pain.   traZODone 50 MG tablet Commonly known as: DESYREL Take 50 mg by mouth at bedtime as needed for sleep.       Diagnostic Studies: DG Lumbar Spine 2-3 Views  Result Date: 12/21/2019 CLINICAL DATA:  L3-S1 fusion EXAM: LUMBAR SPINE - 2-3 VIEW; DG C-ARM 1-60 MIN COMPARISON:  None. FINDINGS: Two intraoperative spot images demonstrate changes of posterior fusion from L3-S1. Normal alignment. No hardware or bony complicating feature. IMPRESSION: L3-S1 fusion posteriorly.  No visible complicating feature. Electronically Signed   By: 02/20/2020  D[place] M.D.   On: 12/21/2019 16:42   DG Lumbar Spine 2-3 Views  Result Date: 12/20/2019 CLINICAL DATA:  Disc spacer placements EXAM: LUMBAR SPINE - 2-3 VIEW; DG C-ARM 1-60 MIN COMPARISON:  July 01, 2019 FLUOROSCOPY TIME:  2 minutes 41 seconds; 3 acquired images FINDINGS: Frontal and lateral views obtained. Previously noted screw and plate fixation present at L5 and S1 with disc spacer at L5. There are now disc spacers also present at L3-4 and L4-5. No fracture or spondylolisthesis. Visualized disc spaces appear unremarkable. IMPRESSION: Disc spacers now present at L3-4, L4-5, and L5-S1. Postoperative screw and plate fixation at L5 and S1. No fracture or  spondylolisthesis. Visualized disc spaces appear unremarkable. Electronically Signed   By: Lowella Grip III M.D.   On: 12/20/2019 19:29   DG C-Arm 1-60 Min  Result Date: 12/21/2019 CLINICAL DATA:  L3-S1 fusion EXAM: LUMBAR SPINE - 2-3 VIEW; DG C-ARM 1-60 MIN COMPARISON:  None. FINDINGS: Two intraoperative spot images demonstrate changes of posterior fusion from L3-S1. Normal alignment. No hardware or bony complicating feature. IMPRESSION: L3-S1 fusion posteriorly.  No visible complicating feature. Electronically Signed   By: Rolm Baptise M.D.   On: 12/21/2019 16:42   DG C-Arm 1-60 Min  Result Date: 12/20/2019 CLINICAL DATA:  Disc spacer placements EXAM: LUMBAR SPINE - 2-3 VIEW; DG C-ARM 1-60 MIN COMPARISON:  July 01, 2019 FLUOROSCOPY TIME:  2 minutes 41 seconds; 3 acquired images FINDINGS: Frontal and lateral views obtained. Previously noted screw and plate fixation present at L5 and S1 with disc spacer at L5. There are now disc spacers also present at L3-4 and L4-5. No fracture or spondylolisthesis. Visualized disc spaces appear unremarkable. IMPRESSION: Disc spacers now present at L3-4, L4-5, and L5-S1. Postoperative screw and plate fixation at L5 and S1. No fracture or spondylolisthesis. Visualized disc spaces appear unremarkable. Electronically Signed   By: Lowella Grip III M.D.   On: 12/20/2019 19:29    Disposition: Discharge disposition: 01-Home or Self Care        POD 1 s/p L3/4 and L4/5 lateral/posterior fusion, doing exceptionally well, with resolved preop leg pain  - encourage ambulation - Percocet for pain, Robaxin for muscle spasms -Scripts for pain sent to pharmacy electronically  -D/C instructions sheet printed and in chart -D/C today  -F/U in office 2 weeks   Signed: Lennie Muckle Francesco Provencal 12/30/2019, 10:13 AM

## 2021-04-24 IMAGING — RF DG LUMBAR SPINE 2-3V
1 series · 6 of 6 positions shown · non-contrast
Comparison: None.

CLINICAL DATA: Anterior and posterior fusion at L5-S1.

EXAM:
DG C-ARM 1-60 MIN; LUMBAR SPINE - 2-3 VIEW

[Series 1: run · 6 of 6 slices shown]
[im 1/6]
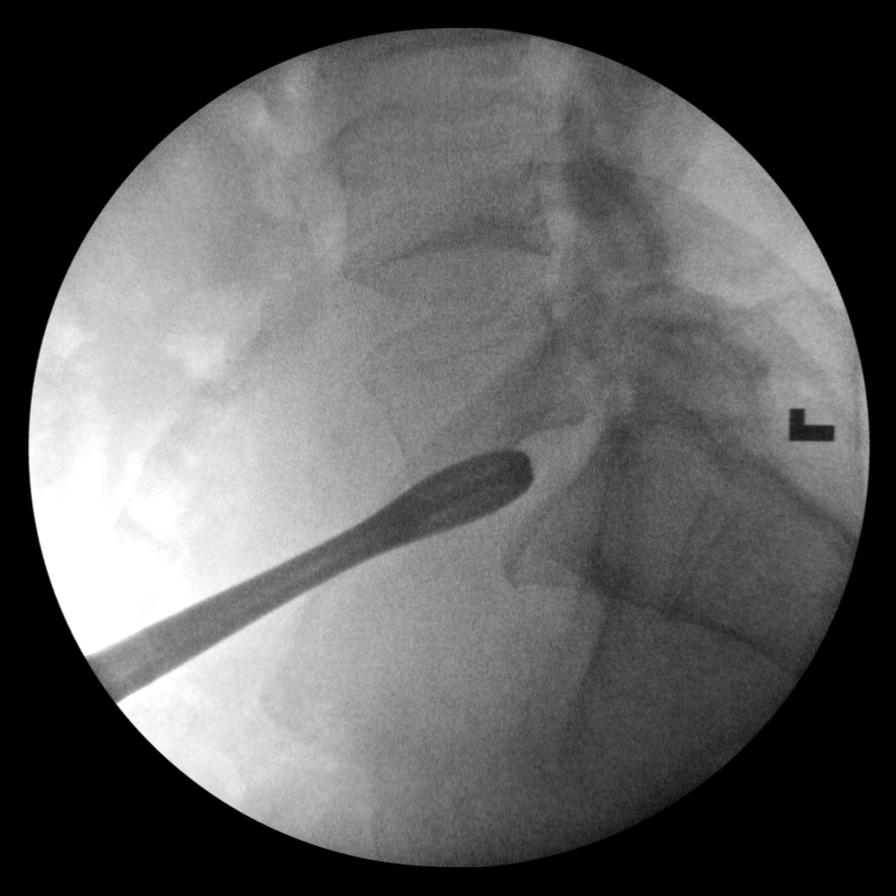
[im 2/6]
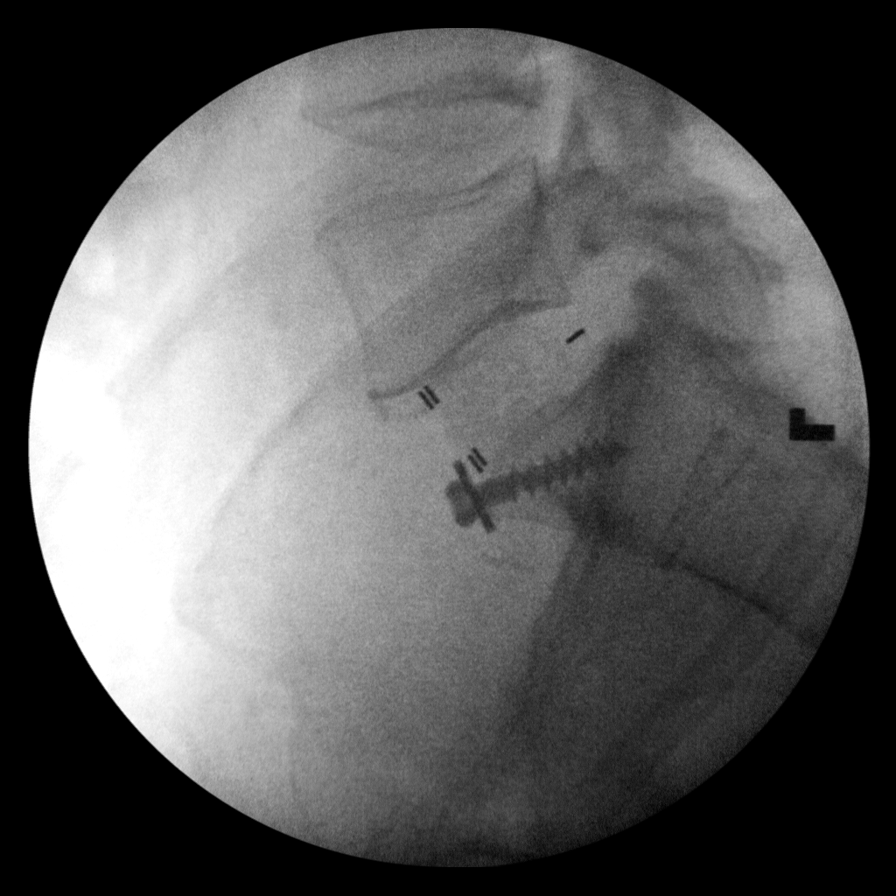
[im 3/6]
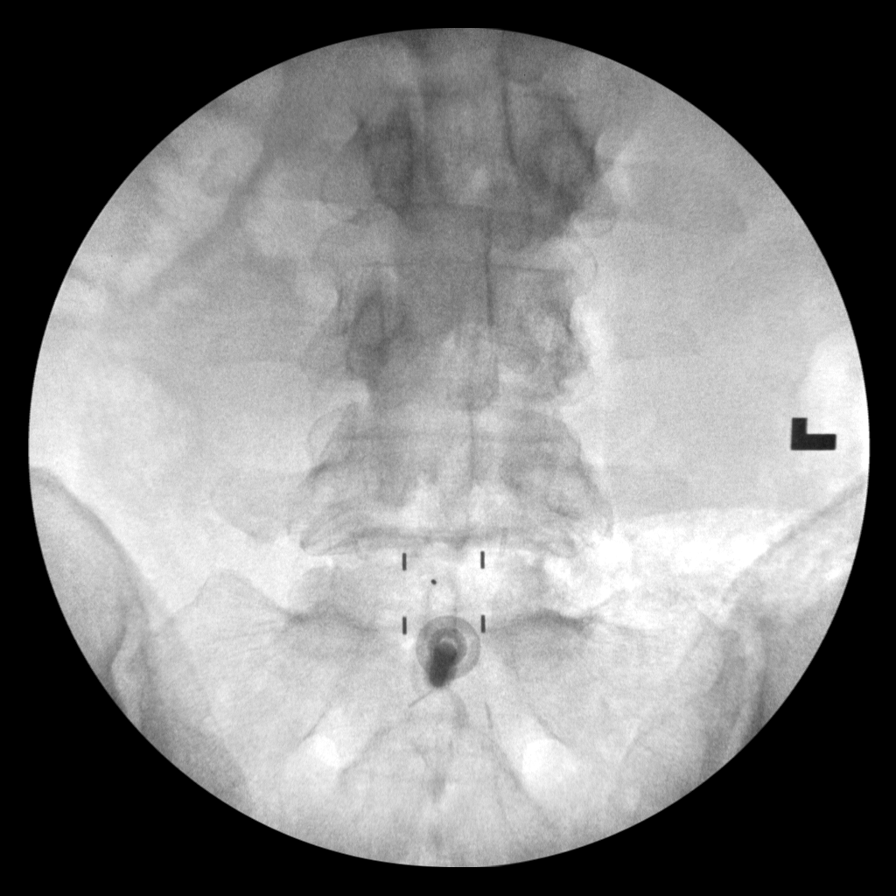
[im 4/6]
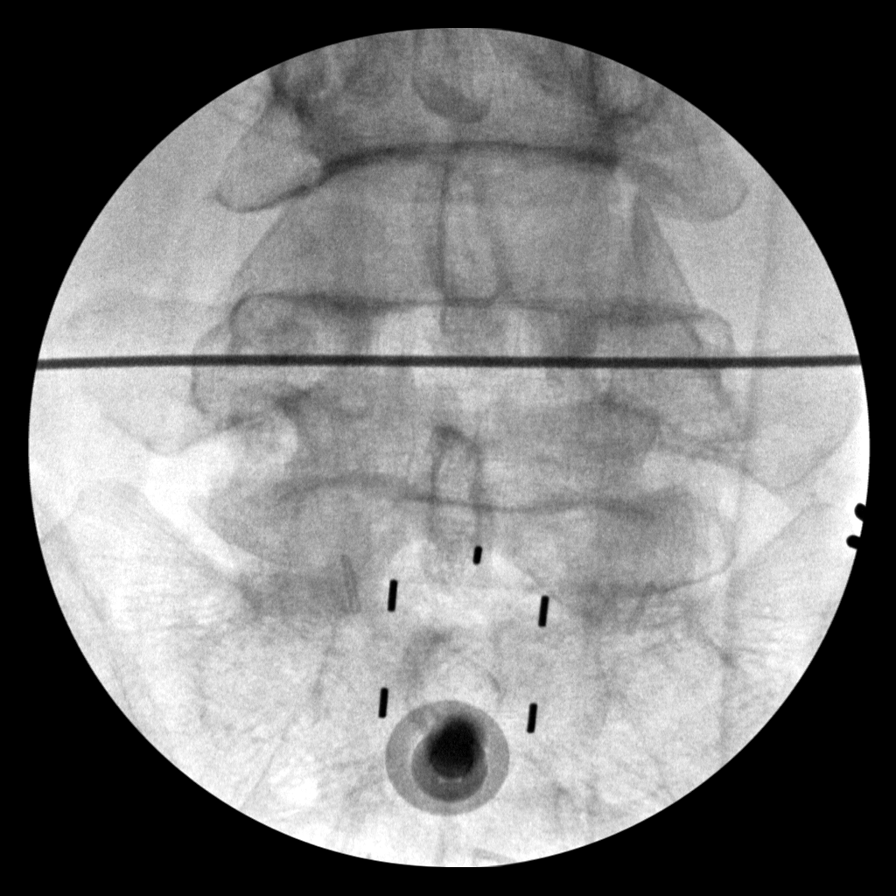
[im 5/6]
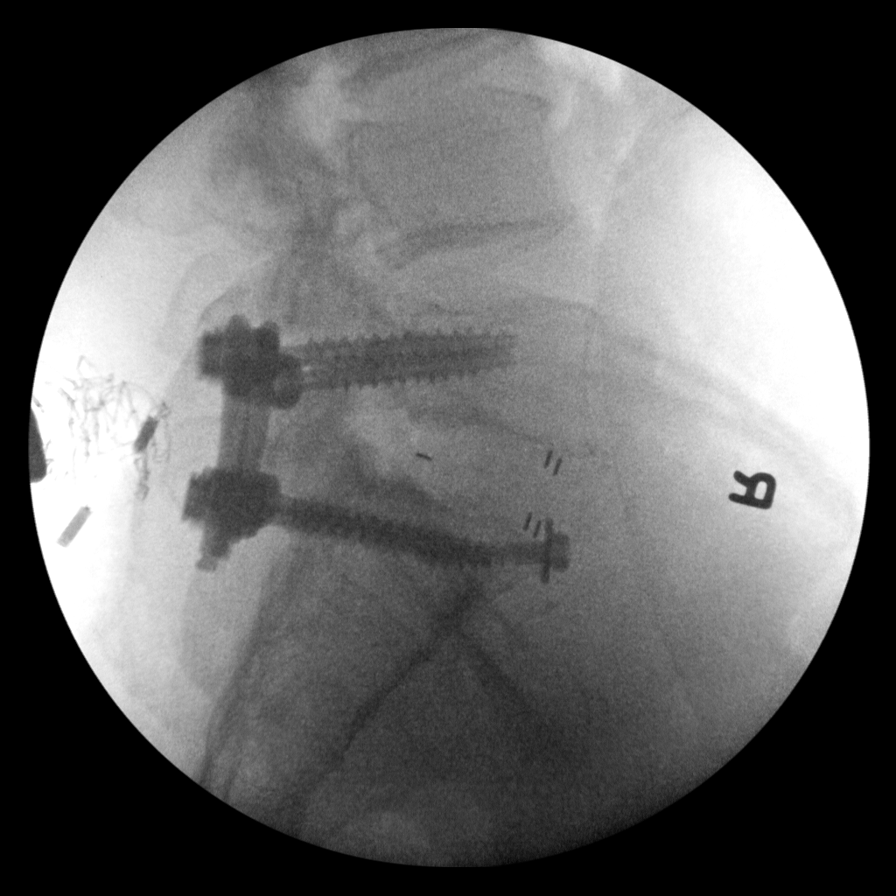
[im 6/6]
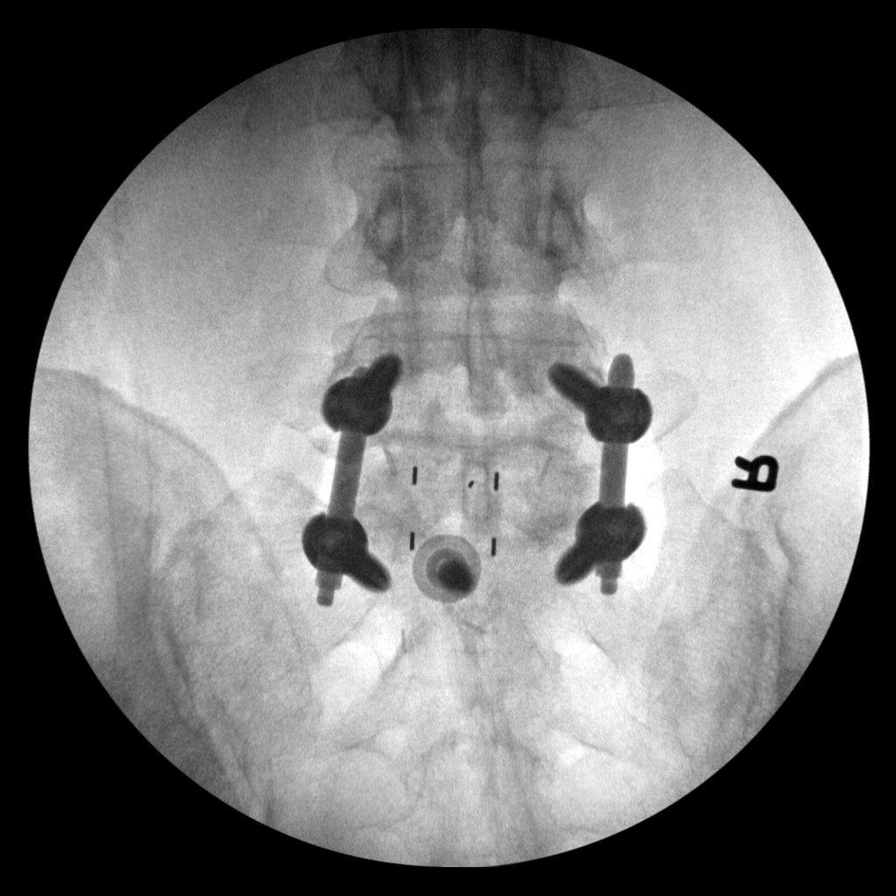

[6 of 6 positions shown; findings below may reference images not displayed]

FINDINGS: Anterior and posterior and interbody fusion hardware at L5-S1. Good
position of the hardware without complicating features.
IMPRESSION: L5-S1 fusion.

## 2021-10-13 IMAGING — RF DG C-ARM 1-60 MIN
1 series · 3 of 3 positions shown · non-contrast
Comparison: July 01, 2019

FLUOROSCOPY TIME:  2 minutes 41 seconds; 3 acquired images

CLINICAL DATA: Disc spacer placements

EXAM:
LUMBAR SPINE - 2-3 VIEW; DG C-ARM 1-60 MIN

[Series 1: run · 3 of 3 slices shown]
[im 1/3]
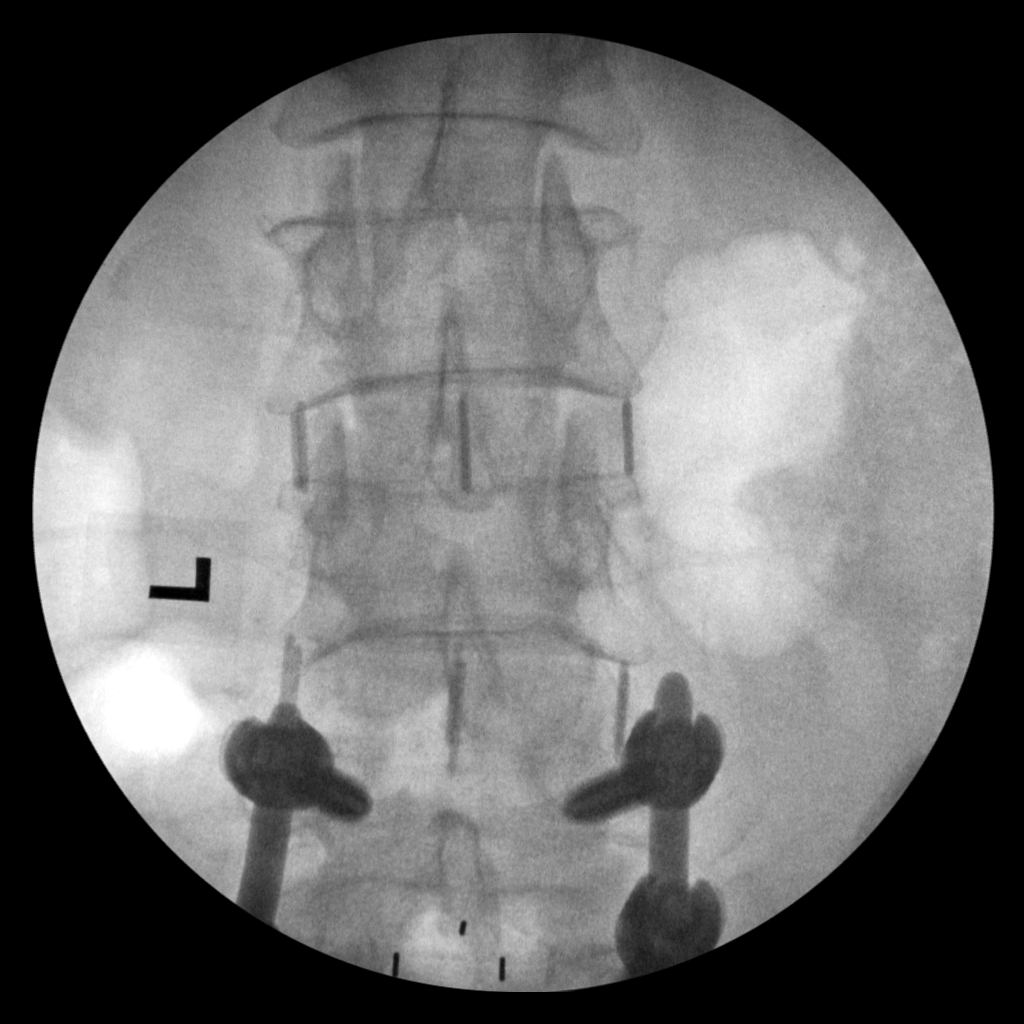
[im 2/3]
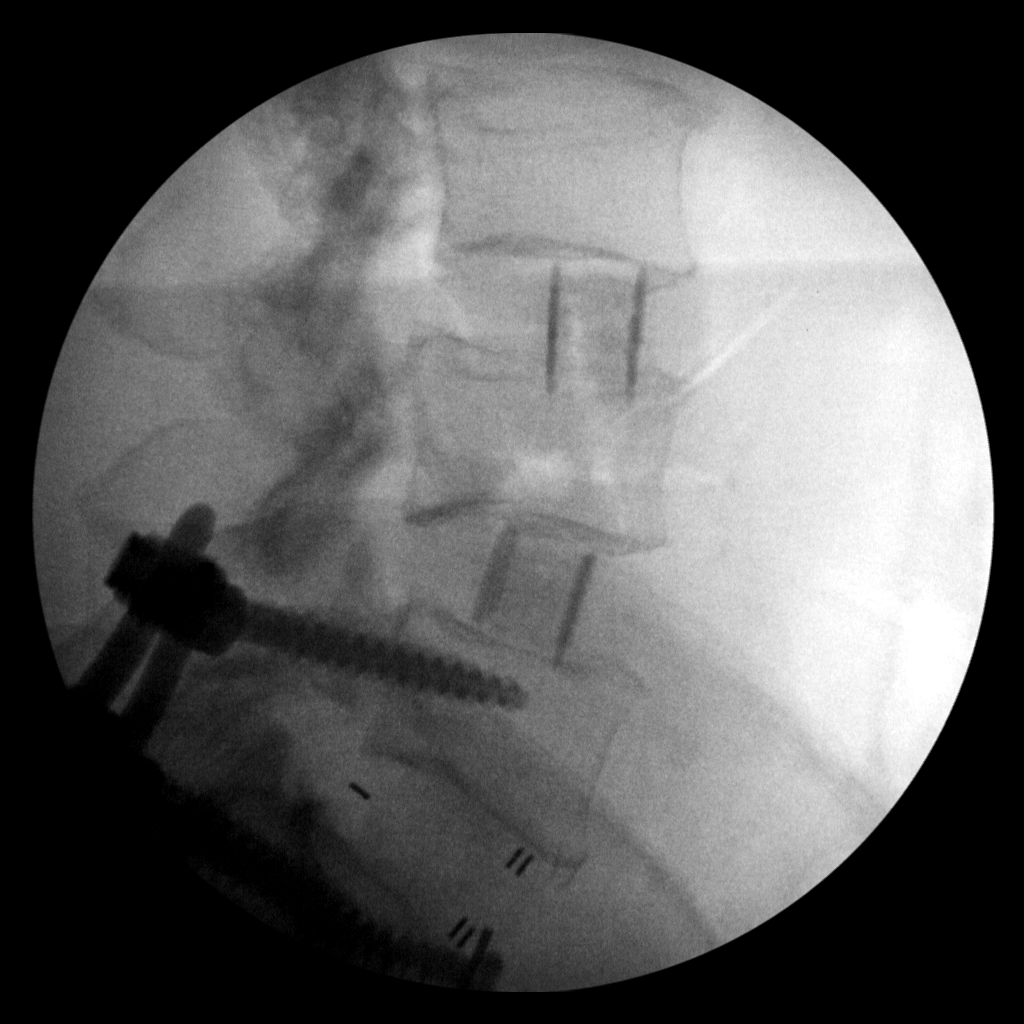
[im 3/3]
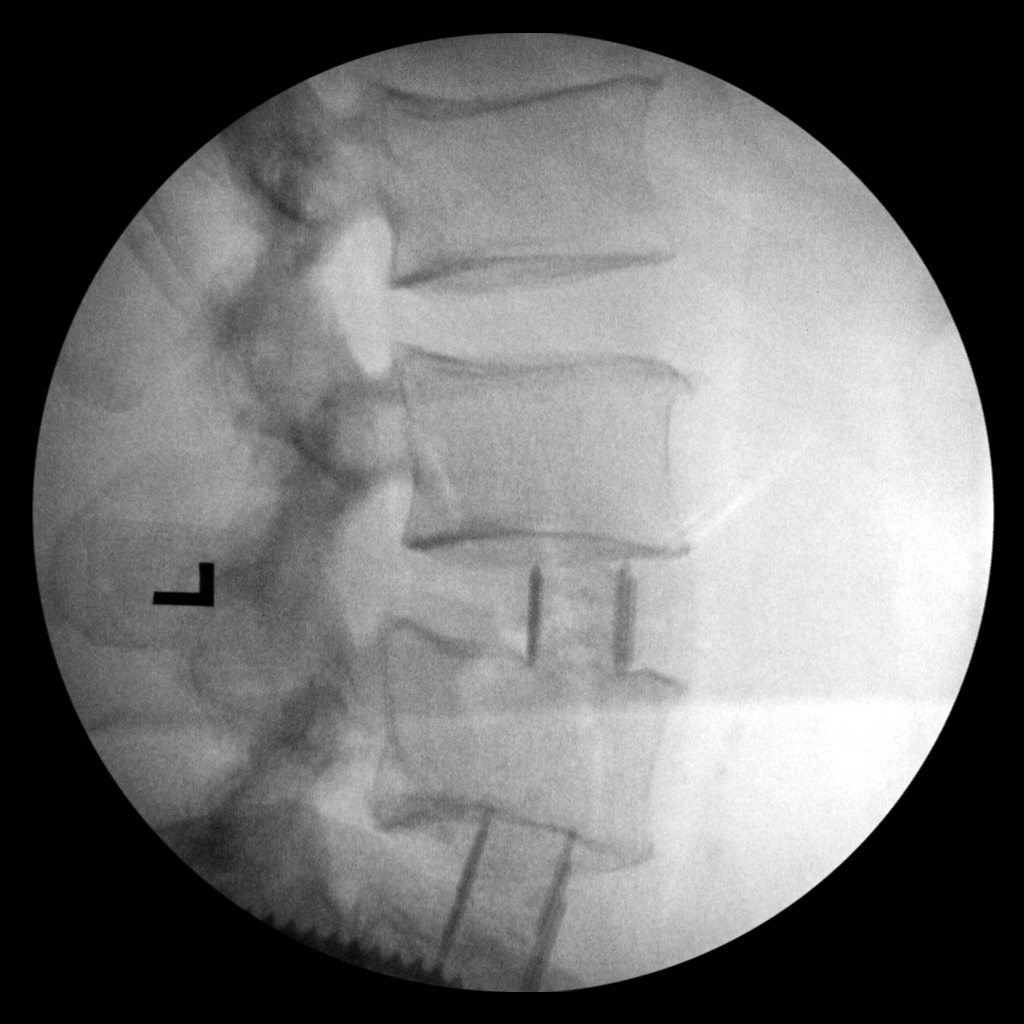

[3 of 3 positions shown; findings below may reference images not displayed]

FINDINGS: Frontal and lateral views obtained. Previously noted screw and plate
fixation present at L5 and S1 with disc spacer at L5. There are now
disc spacers also present at L3-4 and L4-5. No fracture or
spondylolisthesis. Visualized disc spaces appear unremarkable.
IMPRESSION: Disc spacers now present at L3-4, L4-5, and L5-S1. Postoperative
screw and plate fixation at L5 and S1. No fracture or
spondylolisthesis. Visualized disc spaces appear unremarkable.
